# Patient Record
Sex: Female | Born: 1955 | Race: White | Hispanic: No | Smoking: Former smoker
Health system: Southern US, Community
[De-identification: ages and names within clinical notes are randomized; demographics above are authoritative.]

## PROBLEM LIST (undated history)

## (undated) DIAGNOSIS — R519 Headache, unspecified: Secondary | ICD-10-CM

## (undated) DIAGNOSIS — R42 Dizziness and giddiness: Secondary | ICD-10-CM

## (undated) DIAGNOSIS — D649 Anemia, unspecified: Secondary | ICD-10-CM

## (undated) DIAGNOSIS — M1990 Unspecified osteoarthritis, unspecified site: Secondary | ICD-10-CM

## (undated) DIAGNOSIS — R51 Headache: Secondary | ICD-10-CM

## (undated) DIAGNOSIS — Z8489 Family history of other specified conditions: Secondary | ICD-10-CM

## (undated) DIAGNOSIS — R011 Cardiac murmur, unspecified: Secondary | ICD-10-CM

## (undated) DIAGNOSIS — Z9889 Other specified postprocedural states: Secondary | ICD-10-CM

## (undated) DIAGNOSIS — R112 Nausea with vomiting, unspecified: Secondary | ICD-10-CM

## (undated) HISTORY — PX: SHOULDER SURGERY: SHX246

## (undated) HISTORY — PX: JOINT REPLACEMENT: SHX530

## (undated) HISTORY — PX: FRACTURE SURGERY: SHX138

## (undated) HISTORY — PX: TONSILLECTOMY: SUR1361

## (undated) HISTORY — PX: WRIST FRACTURE SURGERY: SHX121

## (undated) HISTORY — PX: BREAST SURGERY: SHX581

## (undated) HISTORY — PX: ABDOMINAL HYSTERECTOMY: SHX81

## (undated) HISTORY — PX: ACHILLES TENDON REPAIR: SUR1153

## (undated) HISTORY — PX: BACK SURGERY: SHX140

---

## 1999-12-09 ENCOUNTER — Other Ambulatory Visit: Admission: RE | Admit: 1999-12-09 | Discharge: 1999-12-09 | Payer: Self-pay | Admitting: Obstetrics and Gynecology

## 2004-09-07 ENCOUNTER — Emergency Department (HOSPITAL_COMMUNITY): Admission: EM | Admit: 2004-09-07 | Discharge: 2004-09-07 | Payer: Self-pay | Admitting: Family Medicine

## 2006-05-04 ENCOUNTER — Emergency Department (HOSPITAL_COMMUNITY): Admission: EM | Admit: 2006-05-04 | Discharge: 2006-05-04 | Payer: Self-pay | Admitting: Family Medicine

## 2009-11-16 ENCOUNTER — Emergency Department (HOSPITAL_COMMUNITY): Admission: EM | Admit: 2009-11-16 | Discharge: 2009-11-16 | Payer: Self-pay | Admitting: Emergency Medicine

## 2010-01-29 ENCOUNTER — Encounter: Admission: RE | Admit: 2010-01-29 | Discharge: 2010-01-29 | Payer: Self-pay | Admitting: Sports Medicine

## 2010-02-24 ENCOUNTER — Encounter
Admission: RE | Admit: 2010-02-24 | Discharge: 2010-02-24 | Payer: Self-pay | Source: Home / Self Care | Attending: Obstetrics and Gynecology | Admitting: Obstetrics and Gynecology

## 2010-04-04 ENCOUNTER — Encounter: Payer: Self-pay | Admitting: Sports Medicine

## 2010-04-27 ENCOUNTER — Encounter (HOSPITAL_BASED_OUTPATIENT_CLINIC_OR_DEPARTMENT_OTHER)
Admission: RE | Admit: 2010-04-27 | Discharge: 2010-04-27 | Disposition: A | Discharge status: Home / Self Care | Payer: BC Managed Care – PPO | Source: Ambulatory Visit | Attending: Orthopedic Surgery | Admitting: Orthopedic Surgery

## 2010-04-27 DIAGNOSIS — Z01810 Encounter for preprocedural cardiovascular examination: Secondary | ICD-10-CM | POA: Insufficient documentation

## 2010-04-29 ENCOUNTER — Ambulatory Visit (HOSPITAL_BASED_OUTPATIENT_CLINIC_OR_DEPARTMENT_OTHER)
Admission: RE | Admit: 2010-04-29 | Discharge: 2010-04-29 | Disposition: A | Discharge status: Home / Self Care | Payer: BC Managed Care – PPO | Source: Ambulatory Visit | Attending: Orthopedic Surgery | Admitting: Orthopedic Surgery

## 2010-04-29 DIAGNOSIS — M25819 Other specified joint disorders, unspecified shoulder: Secondary | ICD-10-CM | POA: Insufficient documentation

## 2010-04-29 DIAGNOSIS — M948X9 Other specified disorders of cartilage, unspecified sites: Secondary | ICD-10-CM | POA: Insufficient documentation

## 2010-04-29 DIAGNOSIS — M7500 Adhesive capsulitis of unspecified shoulder: Secondary | ICD-10-CM | POA: Insufficient documentation

## 2010-04-29 DIAGNOSIS — Z01812 Encounter for preprocedural laboratory examination: Secondary | ICD-10-CM | POA: Insufficient documentation

## 2010-04-29 LAB — POCT HEMOGLOBIN-HEMACUE: Hemoglobin: 12.4 g/dL (ref 12.0–15.0)

## 2010-05-03 NOTE — Op Note (Signed)
  Taylor Travis, Taylor Travis NO.:  [id number]  MEDICAL RECORD NO.:  [id number]           PATIENT TYPE:  LOCATION:                                 FACILITY:  PHYSICIAN:  Loreta Ave, M.D. DATE OF BIRTH:  Nov 06, 1955  DATE OF PROCEDURE:  04/29/2010 DATE OF DISCHARGE:                              OPERATIVE REPORT   PREOPERATIVE DIAGNOSES:  Right shoulder marked adhesive capsulitis. Subacromial impingement.  Distal clavicle osteolysis.  POSTOPERATIVE DIAGNOSES:  Right shoulder marked adhesive capsulitis. Subacromial impingement.  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  Also, a focal deep grade 3 lesion, posteroinferior aspect humeral head 1.5 cm diameter with chondral debris.  PROCEDURE:  Right shoulder exam under anesthesia, arthroscopy. Manipulation and break up of adhesions.  Extensive debridement of synovitis, intra and extra-articular adhesions.  Chondroplasty of humeral head.  Bursectomy, acromioplasty, and CA ligament release. Excision of distal clavicle.  SURGEON:  Loreta Ave, MD  ASSISTANT:  Genene Churn. Barry Dienes, PA  ANESTHESIA:  General.  BLOOD LOSS:  Minimal.  SPECIMEN:  None.  CULTURES:  None.  COMPLICATIONS:  None.  DRESSING:  Soft compressive with sling.  PROCEDURE:  The patient was brought to the operating room and placed on the operating table in supine position.  After adequate anesthesia had been obtained, shoulder was examined.  More than 50% loss of motion. Manipulated breaking up adhesions.  Achieving full motion stable shoulder.  Placed in beach-chair position on the shoulder positioner and prepped and draped in usual sterile fashion.  Three portals anterior, posterior and lateral.  Arthroscope introduced and the shoulder distended and inspected.  Extensive synovitis and adhesions, debrided throughout.  There was a focal 1.5-cm near full-thickness deep  grade 3 lesion, posteroinferior humeral head with chondral debris and fragmentation.  Chondroplasty to a stable surface.  Remaining glenoid looked good.  The labrum, biceps tendon, biceps anchor, rotator cuff all intact.  After thorough debridement from the front and back of adhesion synovitis and the humeral head, cannula redirected subacromially. Marked adhesive bursitis, all debrided out.  Type 3 acromion.  Some abrasive changes on the top of the cuff.  Bursa resected.  Acromioplasty to a type 1 acromion, releasing the CA ligament.  Distal clavicle exposed.  Grade 4 changes with marked spurring.  Periarticular spurs and lateral centimeter of clavicle resected.  Adequacy of decompression confirmed viewing from all portals.  Instruments and fluid removed.  Portals were closed with nylon.  Sterile compressive dressing applied.  Sling applied.  Anesthesia reversed.  Brought to the recovery room.  Tolerated the surgery well.  No complications.     Loreta Ave, M.D.     DFM/MEDQ  D:  04/29/2010  T:  04/30/2010  Job:  [id number]  Electronically Signed by Mckinley Jewel M.D. on 05/03/2010 02:59:17 PM

## 2011-05-22 ENCOUNTER — Encounter (HOSPITAL_COMMUNITY): Payer: Self-pay | Admitting: Emergency Medicine

## 2011-05-22 ENCOUNTER — Emergency Department (HOSPITAL_COMMUNITY)
Admission: EM | Admit: 2011-05-22 | Discharge: 2011-05-22 | Disposition: A | Discharge status: Home Health | Payer: BC Managed Care – PPO | Attending: Emergency Medicine | Admitting: Emergency Medicine

## 2011-05-22 ENCOUNTER — Emergency Department (HOSPITAL_COMMUNITY): Payer: BC Managed Care – PPO

## 2011-05-22 DIAGNOSIS — S52509A Unspecified fracture of the lower end of unspecified radius, initial encounter for closed fracture: Secondary | ICD-10-CM

## 2011-05-22 DIAGNOSIS — S52599A Other fractures of lower end of unspecified radius, initial encounter for closed fracture: Secondary | ICD-10-CM | POA: Insufficient documentation

## 2011-05-22 DIAGNOSIS — W19XXXA Unspecified fall, initial encounter: Secondary | ICD-10-CM | POA: Insufficient documentation

## 2011-05-22 DIAGNOSIS — M25539 Pain in unspecified wrist: Secondary | ICD-10-CM | POA: Insufficient documentation

## 2011-05-22 DIAGNOSIS — M25439 Effusion, unspecified wrist: Secondary | ICD-10-CM | POA: Insufficient documentation

## 2011-05-22 MED ORDER — ONDANSETRON HCL 4 MG/2ML IJ SOLN
4.0000 mg | Freq: Once | INTRAMUSCULAR | Status: AC
  Administered 2011-05-22: 4 mg via INTRAVENOUS
  Filled 2011-05-22: qty 2

## 2011-05-22 MED ORDER — MORPHINE SULFATE 4 MG/ML IJ SOLN
4.0000 mg | Freq: Once | INTRAMUSCULAR | Status: AC
  Administered 2011-05-22: 4 mg via INTRAVENOUS
  Filled 2011-05-22: qty 1

## 2011-05-22 MED ORDER — HYDROCODONE-ACETAMINOPHEN 5-500 MG PO TABS: 1.0000 | ORAL_TABLET | Freq: Four times a day (QID) | ORAL | Status: AC | PRN

## 2011-05-22 NOTE — ED Provider Notes (Signed)
History     CSN: [id number]  Arrival date & time 05/22/11  1840   First MD Initiated Contact with Patient 05/22/11 2010      Chief Complaint  Patient presents with  . Fall  . Wrist Pain    (Consider location/radiation/quality/duration/timing/severity/associated sxs/prior treatment) HPI Comments: Mrs. it going down 3 steps hit her left hip and landed with her right arm twisted behind her now having deformity of the right wrist  Patient is a 56 y.o. female presenting with fall and wrist pain. The history is provided by the patient.  Fall The accident occurred 1 to 2 hours ago. The fall occurred while walking. She fell from a height of 3 to 5 ft. She landed on a hard floor. The pain is present in the right wrist. The pain is at a severity of 5/10. The pain is moderate. She was ambulatory at the scene. There was no entrapment after the fall. There was no drug use involved in the accident. There was no alcohol use involved in the accident. Pertinent negatives include no fever and no numbness. The symptoms are aggravated by flexion and extension.  Wrist Pain Associated symptoms include joint swelling. Pertinent negatives include no fever or numbness.    History reviewed. No pertinent past medical history.  Past Surgical History  Procedure Date  . Achilles tendon repair   . Back surgery   . Shoulder surgery   . Cesarean section     No family history on file.  History  Substance Use Topics  . Smoking status: Former Games developer  . Smokeless tobacco: Not on file  . Alcohol Use: Yes    OB History    Grav Para Term Preterm Abortions TAB SAB Ect Mult Living                  Review of Systems  Constitutional: Negative for fever.  Musculoskeletal: Positive for joint swelling.  Neurological: Negative for numbness.    Allergies  Oxycodone  Home Medications   Current Outpatient Rx  Name Route Sig Dispense Refill  . ACETAMINOPHEN 500 MG PO TABS Oral Take 1,000 mg by mouth every  6 (six) hours as needed. For pain    . HYDROCODONE-ACETAMINOPHEN 5-500 MG PO TABS Oral Take 1-2 tablets by mouth every 6 (six) hours as needed for pain. 15 tablet 0    BP 127/68  Pulse 91  Temp(Src) 98.7 F (37.1 C) (Oral)  Resp 18  SpO2 99%  Physical Exam  Constitutional: She appears well-developed and well-nourished.  HENT:  Head: Normocephalic.  Eyes: Pupils are equal, round, and reactive to light.  Neck: Normal range of motion.  Cardiovascular: Normal rate.   Pulmonary/Chest: Effort normal.  Musculoskeletal:       Obvious swelling, and deformity of right wrist over the radius.  Range of motion of the fingers, but painful can elevate thumb positive pulses, cap refill less than 3 seconds  Neurological: She is alert.  Skin: Skin is warm.    ED Course  Procedures (including critical care time)  Labs Reviewed - No data to display Dg Wrist Complete Right  05/22/2011  *RADIOLOGY REPORT*  Clinical Data: Injured right wrist.  RIGHT WRIST - COMPLETE 3+ VIEW  Comparison: None  Findings: There is a comminuted intra-articular fracture of the distal radius with dorsal impaction.  No definite distal ulnar fracture.  There is widening of the scapholunate joint space.  IMPRESSION: Comminuted intra-articular fracture of the distal radius. Widened scapholunate joint space may  suggest ligament injury or insufficiency.  Original Report Authenticated By: P. Loralie Champagne, M.D.     1. Distal radius fracture       MDM  X-ray reveals a comminuted, impacted distal radius.  Will contact Dr. Melvyn Novas who is on-call for hand surgery        Arman Filter, NP 05/22/11 2120

## 2011-05-22 NOTE — Progress Notes (Signed)
Orthopedic Tech Progress Note Patient Details:  Taylor Travis 29-Sep-1955 [id number]  Other Ortho Devices Type of Ortho Device: Ace wrap Ortho Device Location: (R) UE Ortho Device Interventions: Application  Type of Splint: Sugartong Splint Location: (R) UE Splint Interventions: Application    Jennye Moccasin 05/22/2011, 9:14 PM

## 2011-05-22 NOTE — ED Notes (Signed)
Pt c/o falling down 2 steps & hitting L hip & R arm landing on concrete, pt states, " I feel jarred everywhere, my lower back is aching, my L hip is hurting, & my R wrist is really hurting."

## 2011-05-22 NOTE — ED Notes (Signed)
Pt states she fell down 2 steps just pta.  States she landed on her back with her R arm behind her.  C/o pain to R wrist/forearm and L hip.  Denies LOC.   Denies neck pain.

## 2011-05-22 NOTE — Discharge Instructions (Signed)
Forearm Fracture Your caregiver has diagnosed you as having a broken bone (fracture) of the forearm. This is the part of your arm between the elbow and your wrist. Your forearm is made up of two bones. These are the radius and ulna. A fracture is a break in one or both bones. A cast or splint is used to protect and keep your injured bone from moving. The cast or splint will be on generally for about 5 to 6 weeks, with individual variations. HOME CARE INSTRUCTIONS   Keep the injured part elevated while sitting or lying down. Keeping the injury above the level of your heart (the center of the chest). This will decrease swelling and pain.   Apply ice to the injury for 15 to 20 minutes, 3 to 4 times per day while awake, for 2 days. Put the ice in a plastic bag and place a thin towel between the bag of ice and your cast or splint.   If you have a plaster or fiberglass cast:   Do not try to scratch the skin under the cast using sharp or pointed objects.   Check the skin around the cast every day. You may put lotion on any red or sore areas.   Keep your cast dry and clean.   If you have a plaster splint:   Wear the splint as directed.   You may loosen the elastic around the splint if your fingers become numb, tingle, or turn cold or blue.   Do not put pressure on any part of your cast or splint. It may break. Rest your cast only on a pillow the first 24 hours until it is fully hardened.   Your cast or splint can be protected during bathing with a plastic bag. Do not lower the cast or splint into water.   Only take over-the-counter or prescription medicines for pain, discomfort, or fever as directed by your caregiver.  SEEK IMMEDIATE MEDICAL CARE IF:   Your cast gets damaged or breaks.   You have more severe pain or swelling than you did before the cast.   Your skin or nails below the injury turn blue or gray, or feel cold or numb.   There is a bad smell or new stains and/or pus like  (purulent) drainage coming from under the cast.  MAKE SURE YOU:   Understand these instructions.   Will watch your condition.   Will get help right away if you are not doing well or get worse.  Document Released: 02/26/2000 Document Revised: 02/17/2011 Document Reviewed: 10/18/2007 [facility] Patient Information 2012 [place], [place]. Dr. Cheree Ditto it would be expected to 30 please: The morning to verify this appointment

## 2011-05-22 NOTE — ED Provider Notes (Signed)
Medical screening examination/treatment/procedure(s) were performed by non-physician practitioner and as supervising physician I was immediately available for consultation/collaboration.   Forbes Cellar, MD 05/22/11 2129

## 2011-10-04 ENCOUNTER — Other Ambulatory Visit: Payer: Self-pay | Admitting: Obstetrics and Gynecology

## 2011-10-04 DIAGNOSIS — Z1231 Encounter for screening mammogram for malignant neoplasm of breast: Secondary | ICD-10-CM

## 2011-10-14 ENCOUNTER — Ambulatory Visit: Payer: BC Managed Care – PPO

## 2011-10-21 ENCOUNTER — Ambulatory Visit
Admission: RE | Admit: 2011-10-21 | Discharge: 2011-10-21 | Disposition: A | Discharge status: Home / Self Care | Payer: BC Managed Care – PPO | Source: Ambulatory Visit | Attending: Obstetrics and Gynecology | Admitting: Obstetrics and Gynecology

## 2011-10-21 DIAGNOSIS — Z1231 Encounter for screening mammogram for malignant neoplasm of breast: Secondary | ICD-10-CM

## 2012-10-19 ENCOUNTER — Other Ambulatory Visit: Payer: Self-pay

## 2012-10-19 DIAGNOSIS — Z1231 Encounter for screening mammogram for malignant neoplasm of breast: Secondary | ICD-10-CM

## 2012-11-01 ENCOUNTER — Ambulatory Visit
Admission: RE | Admit: 2012-11-01 | Discharge: 2012-11-01 | Disposition: A | Discharge status: Home / Self Care | Payer: BC Managed Care – PPO | Source: Ambulatory Visit

## 2012-11-01 DIAGNOSIS — Z1231 Encounter for screening mammogram for malignant neoplasm of breast: Secondary | ICD-10-CM

## 2012-12-20 ENCOUNTER — Ambulatory Visit: Payer: Self-pay

## 2012-12-20 ENCOUNTER — Other Ambulatory Visit: Payer: Self-pay | Admitting: Occupational Medicine

## 2012-12-20 DIAGNOSIS — R52 Pain, unspecified: Secondary | ICD-10-CM

## 2013-05-21 ENCOUNTER — Emergency Department (HOSPITAL_COMMUNITY)
Admission: EM | Admit: 2013-05-21 | Discharge: 2013-05-21 | Disposition: A | Discharge status: Home / Self Care | Payer: BC Managed Care – PPO | Source: Home / Self Care

## 2013-05-21 ENCOUNTER — Encounter (HOSPITAL_COMMUNITY): Payer: Self-pay | Admitting: Emergency Medicine

## 2013-05-21 DIAGNOSIS — K529 Noninfective gastroenteritis and colitis, unspecified: Secondary | ICD-10-CM

## 2013-05-21 DIAGNOSIS — K5289 Other specified noninfective gastroenteritis and colitis: Secondary | ICD-10-CM

## 2013-05-21 LAB — POCT I-STAT, CHEM 8
BUN: 13 mg/dL (ref 6–23)
CALCIUM ION: 1.16 mmol/L (ref 1.12–1.23)
Chloride: 103 mEq/L (ref 96–112)
Creatinine, Ser: 0.9 mg/dL (ref 0.50–1.10)
GLUCOSE: 100 mg/dL — AB (ref 70–99)
HEMATOCRIT: 44 % (ref 36.0–46.0)
HEMOGLOBIN: 15 g/dL (ref 12.0–15.0)
Potassium: 3.9 mEq/L (ref 3.7–5.3)
Sodium: 141 mEq/L (ref 137–147)
TCO2: 27 mmol/L (ref 0–100)

## 2013-05-21 MED ORDER — MECLIZINE HCL 50 MG PO TABS
50.0000 mg | ORAL_TABLET | Freq: Three times a day (TID) | ORAL | Status: DC | PRN
Start: 2013-05-21 — End: 2013-09-19

## 2013-05-21 NOTE — ED Notes (Signed)
Pt  Reports  Nausea     dizzyness  And  Headache     She  Has         Had diarrhea      And  Vomiting  sev days     The  Symptoms    Stopped  yest          She  States  She    Has      Vertigo  In past

## 2013-05-21 NOTE — ED Provider Notes (Signed)
CSN: [id number]     Arrival date & time 05/21/13  1157 History   None    Chief Complaint  Patient presents with  . Dizziness   (Consider location/radiation/quality/duration/timing/severity/associated sxs/prior Treatment) Patient is a 58 y.o. female presenting with dizziness. The history is provided by the patient and a parent.  Dizziness Quality:  Imbalance Severity:  Mild Onset quality:  Sudden Duration:  1 day Progression:  Unchanged Chronicity:  New Context: standing up   Context comment:  Onset of n/v/d on sun, resolved , stillwith gait unsteadiness. Associated symptoms: diarrhea and vomiting   Associated symptoms: no palpitations and no shortness of breath     History reviewed. No pertinent past medical history. Past Surgical History  Procedure Laterality Date  . Achilles tendon repair    . Back surgery    . Shoulder surgery    . Cesarean section     History reviewed. No pertinent family history. History  Substance Use Topics  . Smoking status: Former Games developermoker  . Smokeless tobacco: Not on file  . Alcohol Use: Yes   OB History   Grav Para Term Preterm Abortions TAB SAB Ect Mult Living                 Review of Systems  Constitutional: Negative.   Respiratory: Negative for shortness of breath.   Cardiovascular: Negative for palpitations.  Gastrointestinal: Positive for vomiting and diarrhea.  Musculoskeletal: Positive for gait problem.  Skin: Negative.   Neurological: Positive for dizziness.    Allergies  Oxycodone  Home Medications   Current Outpatient Rx  Name  Route  Sig  Dispense  Refill  . MECLIZINE HCL PO   Oral   Take by mouth.         Marland Kitchen. acetaminophen (TYLENOL) 500 MG tablet   Oral   Take 1,000 mg by mouth every 6 (six) hours as needed. For pain         . meclizine (ANTIVERT) 50 MG tablet   Oral   Take 1 tablet (50 mg total) by mouth 3 (three) times daily as needed.   20 tablet   0    BP 149/91  Pulse 105  Temp(Src) 98.7 F (37.1  C) (Oral)  Resp 16  SpO2 100% Physical Exam  Nursing note and vitals reviewed. Constitutional: She is oriented to person, place, and time. She appears well-developed and well-nourished.  HENT:  Mouth/Throat: Oropharynx is clear and moist.  Eyes: EOM are normal. Pupils are equal, round, and reactive to light.  Neck: Normal range of motion. Neck supple.  Cardiovascular: Normal rate, regular rhythm, normal heart sounds and intact distal pulses.   Pulmonary/Chest: Effort normal and breath sounds normal.  Lymphadenopathy:    She has no cervical adenopathy.  Neurological: She is alert and oriented to person, place, and time.  Skin: Skin is warm and dry.    ED Course  Procedures (including critical care time) Labs Review Labs Reviewed  POCT I-STAT, CHEM 8 - Abnormal; Notable for the following:    Glucose, Bld 100 (*)    All other components within normal limits   Imaging Review No results found. i-stat wnl.  MDM   1. Gastroenteritis, acute        Linna HoffJames D Eugene Zeiders, MD 05/21/13 1432

## 2013-09-17 ENCOUNTER — Other Ambulatory Visit: Payer: Self-pay | Admitting: Physician Assistant

## 2013-09-17 NOTE — H&P (Signed)
TOTAL KNEE ADMISSION H&P  Patient is being admitted for right total knee arthroplasty.  Subjective:  Chief Complaint:right knee pain.  HPI: Taylor Travis, 58 y.o. female, has a history of pain and functional disability in the right knee due to arthritis and has failed non-surgical conservative treatments for greater than 12 weeks to includeNSAID's and/or analgesics, corticosteriod injections and activity modification.  Onset of symptoms was gradual, starting 2 years ago with gradually worsening course since that time. The patient noted prior procedures on the knee to include  arthroscopy and menisectomy on the right knee(s).  Patient currently rates pain in the right knee(s) at 8 out of 10 with activity. Patient has night pain, worsening of pain with activity and weight bearing, pain that interferes with activities of daily living, pain with passive range of motion, crepitus and joint swelling.  Patient has evidence of subchondral cysts, subchondral sclerosis and joint space narrowing by imaging studies. There is no active infection.  There are no active problems to display for this patient.  No past medical history on file.  Past Surgical History  Procedure Laterality Date  . Achilles tendon repair    . Back surgery    . Shoulder surgery    . Cesarean section       (Not in a hospital admission) Allergies  Allergen Reactions  . Oxycodone Nausea And Vomiting    History  Substance Use Topics  . Smoking status: Former Games developermoker  . Smokeless tobacco: Not on file  . Alcohol Use: Yes    No family history on file.   Review of Systems  Constitutional: Negative.   HENT: Negative.   Eyes: Negative.   Respiratory: Negative.   Cardiovascular: Negative.   Gastrointestinal: Negative.   Genitourinary: Negative.   Musculoskeletal: Positive for joint pain.  Skin: Negative.   Neurological: Negative.   Endo/Heme/Allergies: Negative.   Psychiatric/Behavioral: Negative.      Objective:  Physical Exam  Constitutional: She is oriented to person, place, and time. She appears well-developed and well-nourished.  HENT:  Head: Normocephalic and atraumatic.  Eyes: EOM are normal.  Neck: Normal range of motion. Neck supple.  Cardiovascular: Normal rate, regular rhythm and normal heart sounds.  Exam reveals no gallop and no friction rub.   No murmur heard. Respiratory: Effort normal. No respiratory distress. She has no wheezes. She has no rales.  GI: Soft. Bowel sounds are normal. She exhibits no distension.  Musculoskeletal:  Antalgic gait, valgus thrust on the right.  Negative straight leg raise both sides.  Negative log roll of both hips.  Right knee motion 5-90 with more than ten degrees of valgus.  The left is 0-100.  In a little bit of varus.  Profound patellofemoral crepitus, both sides.  Neurovascularly intact distally.    Neurological: She is alert and oriented to person, place, and time.  Skin: Skin is warm and dry.  Psychiatric: She has a normal mood and affect. Her behavior is normal. Judgment and thought content normal.    Vital signs in last 24 hours: @VSRANGES @  Labs:   There is no height or weight on file to calculate BMI.   Imaging Review Plain radiographs demonstrate severe degenerative joint disease of the right knee(s). The overall alignment ismild varus. The bone quality appears to be fair for age and reported activity level.  Assessment/Plan:  End stage arthritis, right knee   The patient history, physical examination, clinical judgment of the provider and imaging studies are consistent with end stage  degenerative joint disease of the right knee(s) and total knee arthroplasty is deemed medically necessary. The treatment options including medical management, injection therapy arthroscopy and arthroplasty were discussed at length. The risks and benefits of total knee arthroplasty were presented and reviewed. The risks due to aseptic  loosening, infection, stiffness, patella tracking problems, thromboembolic complications and other imponderables were discussed. The patient acknowledged the explanation, agreed to proceed with the plan and consent was signed. Patient is being admitted for inpatient treatment for surgery, pain control, PT, OT, prophylactic antibiotics, VTE prophylaxis, progressive ambulation and ADL's and discharge planning. The patient is planning to be discharged home with home health services

## 2013-09-24 ENCOUNTER — Other Ambulatory Visit (HOSPITAL_COMMUNITY): Payer: Self-pay

## 2013-09-24 ENCOUNTER — Ambulatory Visit (HOSPITAL_COMMUNITY)
Admission: RE | Admit: 2013-09-24 | Discharge: 2013-09-24 | Disposition: A | Discharge status: Home / Self Care | Payer: BC Managed Care – PPO | Source: Ambulatory Visit | Attending: Physician Assistant | Admitting: Physician Assistant

## 2013-09-24 ENCOUNTER — Encounter (HOSPITAL_COMMUNITY)
Admission: RE | Admit: 2013-09-24 | Discharge: 2013-09-24 | Disposition: A | Discharge status: Home / Self Care | Payer: BC Managed Care – PPO | Source: Ambulatory Visit | Attending: Orthopedic Surgery | Admitting: Orthopedic Surgery

## 2013-09-24 ENCOUNTER — Encounter (HOSPITAL_COMMUNITY): Payer: Self-pay

## 2013-09-24 DIAGNOSIS — Z01818 Encounter for other preprocedural examination: Secondary | ICD-10-CM | POA: Diagnosis present

## 2013-09-24 HISTORY — DX: Other specified postprocedural states: R11.2

## 2013-09-24 HISTORY — DX: Cardiac murmur, unspecified: R01.1

## 2013-09-24 HISTORY — DX: Unspecified osteoarthritis, unspecified site: M19.90

## 2013-09-24 HISTORY — DX: Other specified postprocedural states: Z98.890

## 2013-09-24 LAB — ABO/RH: ABO/RH(D): AB NEG

## 2013-09-24 LAB — COMPREHENSIVE METABOLIC PANEL
ALBUMIN: 4 g/dL (ref 3.5–5.2)
ALT: 20 U/L (ref 0–35)
AST: 17 U/L (ref 0–37)
Alkaline Phosphatase: 83 U/L (ref 39–117)
Anion gap: 13 (ref 5–15)
BUN: 21 mg/dL (ref 6–23)
CALCIUM: 9.2 mg/dL (ref 8.4–10.5)
CO2: 26 mEq/L (ref 19–32)
CREATININE: 0.75 mg/dL (ref 0.50–1.10)
Chloride: 101 mEq/L (ref 96–112)
GFR calc Af Amer: >90 mL/min (ref >90)
GFR calc non Af Amer: >90 mL/min (ref >90)
Glucose, Bld: 96 mg/dL (ref 70–99)
Potassium: 4.1 mEq/L (ref 3.7–5.3)
Sodium: 140 mEq/L (ref 137–147)
Total Bilirubin: 0.3 mg/dL (ref 0.3–1.2)
Total Protein: 7.1 g/dL (ref 6.0–8.3)

## 2013-09-24 LAB — CBC WITH DIFFERENTIAL/PLATELET
BASOS ABS: 0 10*3/uL (ref 0.0–0.1)
Basophils Relative: 0 % (ref 0–1)
EOS PCT: 3 % (ref 0–5)
Eosinophils Absolute: 0.3 10*3/uL (ref 0.0–0.7)
HEMATOCRIT: 39 % (ref 36.0–46.0)
Hemoglobin: 12.8 g/dL (ref 12.0–15.0)
Lymphocytes Relative: 32 % (ref 12–46)
Lymphs Abs: 2.5 10*3/uL (ref 0.7–4.0)
MCH: 28.3 pg (ref 26.0–34.0)
MCHC: 32.8 g/dL (ref 30.0–36.0)
MCV: 86.1 fL (ref 78.0–100.0)
MONO ABS: 0.5 10*3/uL (ref 0.1–1.0)
Monocytes Relative: 6 % (ref 3–12)
Neutro Abs: 4.7 10*3/uL (ref 1.7–7.7)
Neutrophils Relative %: 59 % (ref 43–77)
PLATELETS: 193 10*3/uL (ref 150–400)
RBC: 4.53 MIL/uL (ref 3.87–5.11)
RDW: 13.3 % (ref 11.5–15.5)
WBC: 8 10*3/uL (ref 4.0–10.5)

## 2013-09-24 LAB — URINALYSIS, ROUTINE W REFLEX MICROSCOPIC
BILIRUBIN URINE: NEGATIVE
Glucose, UA: NEGATIVE mg/dL
Hgb urine dipstick: NEGATIVE
Ketones, ur: NEGATIVE mg/dL
Leukocytes, UA: NEGATIVE
NITRITE: NEGATIVE
Protein, ur: NEGATIVE mg/dL
Specific Gravity, Urine: 1.026 (ref 1.005–1.030)
UROBILINOGEN UA: 0.2 mg/dL (ref 0.0–1.0)
pH: 5.5 (ref 5.0–8.0)

## 2013-09-24 LAB — PROTIME-INR
INR: 1.02 (ref 0.00–1.49)
PROTHROMBIN TIME: 13.4 s (ref 11.6–15.2)

## 2013-09-24 LAB — SURGICAL PCR SCREEN
MRSA, PCR: NEGATIVE
STAPHYLOCOCCUS AUREUS: NEGATIVE

## 2013-09-24 LAB — TYPE AND SCREEN
ABO/RH(D): AB NEG
ANTIBODY SCREEN: NEGATIVE

## 2013-09-24 LAB — APTT: APTT: 30 s (ref 24–37)

## 2013-09-24 MED ORDER — LACTATED RINGERS IV SOLN: INTRAVENOUS | Status: DC

## 2013-09-24 NOTE — Pre-Procedure Instructions (Signed)
Taylor Travis  09/24/2013   Your procedure is scheduled on:  10/02/13  [facility].  Call this number if you have problems the morning of surgery: [phone]   Remember:   Do not eat food or drink liquids after midnight.   Take these medicines the morning of surgery with A SIP OF WATER: tylenol   Do not wear jewelry, make-up or nail polish.  Do not wear lotions, powders, or perfumes. You may wear deodorant.  Do not shave 48 hours prior to surgery. Men may shave face and neck.  Do not bring valuables to the hospital.  [facility] is not responsible                  for any belongings or valuables.               Contacts, dentures or bridgework may not be worn into surgery.  Leave suitcase in the car. After surgery it may be brought to your room.  For patients admitted to the hospital, discharge time is determined by your                treatment team.               Patients discharged the day of surgery will not be allowed to drive  home.  Name and phone number of your driver: family  Special Instructions: Shower using CHG 2 nights before surgery and the night before surgery.  If you shower the day of surgery use CHG.  Use special wash - you have one bottle of CHG for all showers.  You should use approximately 1/3 of the bottle for each shower.   Please read over the following fact sheets that you were given: Pain Booklet, Coughing and Deep Breathing, Blood Transfusion Information, MRSA Information and Surgical Site Infection Prevention

## 2013-10-01 MED ORDER — DEXTROSE 5 % IV SOLN
3.0000 g | INTRAVENOUS | Status: AC
  Administered 2013-10-02: 3 g via INTRAVENOUS
  Filled 2013-10-01: qty 3000

## 2013-10-02 ENCOUNTER — Encounter (HOSPITAL_COMMUNITY)
Admission: RE | Disposition: A | Discharge status: Home Health | Payer: Self-pay | Source: Ambulatory Visit | Attending: Orthopedic Surgery

## 2013-10-02 ENCOUNTER — Inpatient Hospital Stay (HOSPITAL_COMMUNITY): Payer: BC Managed Care – PPO | Admitting: Anesthesiology

## 2013-10-02 ENCOUNTER — Encounter (HOSPITAL_COMMUNITY): Payer: Self-pay | Admitting: *Deleted

## 2013-10-02 ENCOUNTER — Inpatient Hospital Stay (HOSPITAL_COMMUNITY): Payer: BC Managed Care – PPO

## 2013-10-02 ENCOUNTER — Inpatient Hospital Stay (HOSPITAL_COMMUNITY)
Admission: RE | Admit: 2013-10-02 | Discharge: 2013-10-04 | DRG: 470 | Disposition: A | Discharge status: Home Health | Payer: BC Managed Care – PPO | Source: Ambulatory Visit | Attending: Orthopedic Surgery | Admitting: Orthopedic Surgery

## 2013-10-02 ENCOUNTER — Encounter (HOSPITAL_COMMUNITY): Payer: BC Managed Care – PPO | Admitting: Anesthesiology

## 2013-10-02 DIAGNOSIS — Z87891 Personal history of nicotine dependence: Secondary | ICD-10-CM

## 2013-10-02 DIAGNOSIS — R42 Dizziness and giddiness: Secondary | ICD-10-CM | POA: Diagnosis not present

## 2013-10-02 DIAGNOSIS — M1711 Unilateral primary osteoarthritis, right knee: Secondary | ICD-10-CM

## 2013-10-02 DIAGNOSIS — D62 Acute posthemorrhagic anemia: Secondary | ICD-10-CM | POA: Diagnosis not present

## 2013-10-02 DIAGNOSIS — M1710 Unilateral primary osteoarthritis, unspecified knee: Principal | ICD-10-CM | POA: Diagnosis present

## 2013-10-02 DIAGNOSIS — Z888 Allergy status to other drugs, medicaments and biological substances status: Secondary | ICD-10-CM

## 2013-10-02 DIAGNOSIS — M179 Osteoarthritis of knee, unspecified: Secondary | ICD-10-CM | POA: Diagnosis present

## 2013-10-02 DIAGNOSIS — Z79899 Other long term (current) drug therapy: Secondary | ICD-10-CM

## 2013-10-02 DIAGNOSIS — Z7982 Long term (current) use of aspirin: Secondary | ICD-10-CM

## 2013-10-02 HISTORY — PX: TOTAL KNEE ARTHROPLASTY: SHX125

## 2013-10-02 HISTORY — DX: Dizziness and giddiness: R42

## 2013-10-02 HISTORY — DX: Family history of other specified conditions: Z84.89

## 2013-10-02 SURGERY — ARTHROPLASTY, KNEE, TOTAL
Anesthesia: General | Site: Hip | Laterality: Right

## 2013-10-02 MED ORDER — METHOCARBAMOL 500 MG PO TABS
ORAL_TABLET | ORAL | Status: AC
  Filled 2013-10-02: qty 1

## 2013-10-02 MED ORDER — ONDANSETRON HCL 4 MG/2ML IJ SOLN
INTRAMUSCULAR | Status: DC | PRN
  Administered 2013-10-02: 4 mg via INTRAVENOUS

## 2013-10-02 MED ORDER — PROPOFOL 10 MG/ML IV BOLUS
INTRAVENOUS | Status: AC
  Filled 2013-10-02: qty 20

## 2013-10-02 MED ORDER — EPHEDRINE SULFATE 50 MG/ML IJ SOLN
INTRAMUSCULAR | Status: AC
  Filled 2013-10-02: qty 1

## 2013-10-02 MED ORDER — CELECOXIB 200 MG PO CAPS
200.0000 mg | ORAL_CAPSULE | Freq: Two times a day (BID) | ORAL | Status: DC
  Administered 2013-10-02 – 2013-10-04 (×4): 200 mg via ORAL
  Filled 2013-10-02 (×5): qty 1

## 2013-10-02 MED ORDER — BUPIVACAINE HCL (PF) 0.25 % IJ SOLN
INTRAMUSCULAR | Status: AC
  Filled 2013-10-02: qty 30

## 2013-10-02 MED ORDER — METOCLOPRAMIDE HCL 5 MG PO TABS
5.0000 mg | ORAL_TABLET | Freq: Three times a day (TID) | ORAL | Status: DC | PRN
  Filled 2013-10-02: qty 2

## 2013-10-02 MED ORDER — ONDANSETRON HCL 4 MG/2ML IJ SOLN
INTRAMUSCULAR | Status: AC
  Filled 2013-10-02: qty 2

## 2013-10-02 MED ORDER — MIDAZOLAM HCL 5 MG/5ML IJ SOLN
INTRAMUSCULAR | Status: DC | PRN
  Administered 2013-10-02 (×2): 1 mg via INTRAVENOUS

## 2013-10-02 MED ORDER — PROPOFOL 10 MG/ML IV BOLUS
INTRAVENOUS | Status: DC | PRN
  Administered 2013-10-02: 200 mg via INTRAVENOUS

## 2013-10-02 MED ORDER — MECLIZINE HCL 25 MG PO TABS
50.0000 mg | ORAL_TABLET | Freq: Three times a day (TID) | ORAL | Status: DC | PRN
  Administered 2013-10-03: 50 mg via ORAL
  Filled 2013-10-02: qty 2

## 2013-10-02 MED ORDER — SODIUM CHLORIDE 0.9 % IJ SOLN
INTRAMUSCULAR | Status: DC | PRN
  Administered 2013-10-02: 40 mL via INTRAVENOUS

## 2013-10-02 MED ORDER — ONDANSETRON HCL 4 MG/2ML IJ SOLN
4.0000 mg | Freq: Four times a day (QID) | INTRAMUSCULAR | Status: AC | PRN
  Administered 2013-10-02: 4 mg via INTRAVENOUS

## 2013-10-02 MED ORDER — LACTATED RINGERS IV SOLN
INTRAVENOUS | Status: DC
  Administered 2013-10-02: 09:00:00 via INTRAVENOUS

## 2013-10-02 MED ORDER — MENTHOL 3 MG MT LOZG: 1.0000 | LOZENGE | OROMUCOSAL | Status: DC | PRN

## 2013-10-02 MED ORDER — ONDANSETRON HCL 4 MG/2ML IJ SOLN
4.0000 mg | Freq: Four times a day (QID) | INTRAMUSCULAR | Status: DC | PRN
  Administered 2013-10-02: 4 mg via INTRAVENOUS
  Filled 2013-10-02: qty 2

## 2013-10-02 MED ORDER — ASPIRIN EC 325 MG PO TBEC
325.0000 mg | DELAYED_RELEASE_TABLET | Freq: Every day | ORAL | Status: DC
  Administered 2013-10-03 – 2013-10-04 (×2): 325 mg via ORAL
  Filled 2013-10-02 (×3): qty 1

## 2013-10-02 MED ORDER — POTASSIUM CHLORIDE IN NACL 20-0.9 MEQ/L-% IV SOLN
INTRAVENOUS | Status: DC
  Administered 2013-10-02: 21:00:00 via INTRAVENOUS
  Filled 2013-10-02 (×3): qty 1000

## 2013-10-02 MED ORDER — FENTANYL CITRATE 0.05 MG/ML IJ SOLN
INTRAMUSCULAR | Status: DC | PRN
  Administered 2013-10-02 (×2): 100 ug via INTRAVENOUS
  Administered 2013-10-02 (×6): 50 ug via INTRAVENOUS

## 2013-10-02 MED ORDER — SODIUM CHLORIDE 0.9 % IR SOLN
Status: DC | PRN
  Administered 2013-10-02: 2000 mL

## 2013-10-02 MED ORDER — BISACODYL 5 MG PO TBEC: 5.0000 mg | DELAYED_RELEASE_TABLET | Freq: Every day | ORAL | Status: DC | PRN

## 2013-10-02 MED ORDER — ASPIRIN EC 325 MG PO TBEC: 325.0000 mg | DELAYED_RELEASE_TABLET | Freq: Every day | ORAL | Status: DC

## 2013-10-02 MED ORDER — HYDROCODONE-ACETAMINOPHEN 7.5-325 MG PO TABS: 1.0000 | ORAL_TABLET | ORAL | Status: DC | PRN

## 2013-10-02 MED ORDER — CHLORHEXIDINE GLUCONATE 4 % EX LIQD
60.0000 mL | Freq: Once | CUTANEOUS | Status: DC
  Filled 2013-10-02: qty 60

## 2013-10-02 MED ORDER — FENTANYL CITRATE 0.05 MG/ML IJ SOLN
INTRAMUSCULAR | Status: AC
  Filled 2013-10-02: qty 5

## 2013-10-02 MED ORDER — BUPIVACAINE LIPOSOME 1.3 % IJ SUSP
INTRAMUSCULAR | Status: DC | PRN
  Administered 2013-10-02: 20 mL

## 2013-10-02 MED ORDER — PHENOL 1.4 % MT LIQD: 1.0000 | OROMUCOSAL | Status: DC | PRN

## 2013-10-02 MED ORDER — DIPHENHYDRAMINE HCL 12.5 MG/5ML PO ELIX: 12.5000 mg | ORAL_SOLUTION | ORAL | Status: DC | PRN

## 2013-10-02 MED ORDER — HYDROMORPHONE HCL PF 1 MG/ML IJ SOLN
INTRAMUSCULAR | Status: AC
  Filled 2013-10-02: qty 1

## 2013-10-02 MED ORDER — ONDANSETRON HCL 4 MG PO TABS: 4.0000 mg | ORAL_TABLET | Freq: Four times a day (QID) | ORAL | Status: DC | PRN

## 2013-10-02 MED ORDER — DEXAMETHASONE 6 MG PO TABS
10.0000 mg | ORAL_TABLET | Freq: Three times a day (TID) | ORAL | Status: DC
  Administered 2013-10-03 – 2013-10-04 (×4): 10 mg via ORAL
  Filled 2013-10-02 (×8): qty 1

## 2013-10-02 MED ORDER — LACTATED RINGERS IV SOLN
INTRAVENOUS | Status: DC | PRN
  Administered 2013-10-02: 09:00:00 via INTRAVENOUS

## 2013-10-02 MED ORDER — ACETAMINOPHEN 325 MG PO TABS
650.0000 mg | ORAL_TABLET | Freq: Four times a day (QID) | ORAL | Status: DC | PRN
  Administered 2013-10-04: 650 mg via ORAL
  Filled 2013-10-02: qty 2

## 2013-10-02 MED ORDER — METOCLOPRAMIDE HCL 5 MG/ML IJ SOLN
5.0000 mg | Freq: Three times a day (TID) | INTRAMUSCULAR | Status: DC | PRN
  Administered 2013-10-02: 10 mg via INTRAVENOUS

## 2013-10-02 MED ORDER — HYDROMORPHONE HCL PF 1 MG/ML IJ SOLN
INTRAMUSCULAR | Status: AC
  Administered 2013-10-02: 0.5 mg via INTRAVENOUS
  Filled 2013-10-02: qty 1

## 2013-10-02 MED ORDER — DOCUSATE SODIUM 100 MG PO CAPS
100.0000 mg | ORAL_CAPSULE | Freq: Two times a day (BID) | ORAL | Status: DC
  Administered 2013-10-03 – 2013-10-04 (×3): 100 mg via ORAL
  Filled 2013-10-02 (×4): qty 1

## 2013-10-02 MED ORDER — DEXAMETHASONE 6 MG PO TABS
10.0000 mg | ORAL_TABLET | Freq: Three times a day (TID) | ORAL | Status: DC
  Filled 2013-10-02 (×2): qty 1

## 2013-10-02 MED ORDER — METHOCARBAMOL 1000 MG/10ML IJ SOLN
500.0000 mg | Freq: Four times a day (QID) | INTRAVENOUS | Status: DC | PRN
  Filled 2013-10-02: qty 5

## 2013-10-02 MED ORDER — SUCCINYLCHOLINE CHLORIDE 20 MG/ML IJ SOLN
INTRAMUSCULAR | Status: DC | PRN
  Administered 2013-10-02: 140 mg via INTRAVENOUS

## 2013-10-02 MED ORDER — ALUM & MAG HYDROXIDE-SIMETH 200-200-20 MG/5ML PO SUSP: 30.0000 mL | ORAL | Status: DC | PRN

## 2013-10-02 MED ORDER — ACETAMINOPHEN 650 MG RE SUPP: 650.0000 mg | Freq: Four times a day (QID) | RECTAL | Status: DC | PRN

## 2013-10-02 MED ORDER — METHOCARBAMOL 500 MG PO TABS: 500.0000 mg | ORAL_TABLET | Freq: Four times a day (QID) | ORAL | Status: DC

## 2013-10-02 MED ORDER — HYDROCODONE-ACETAMINOPHEN 10-325 MG PO TABS
1.0000 | ORAL_TABLET | ORAL | Status: DC | PRN
  Administered 2013-10-03 – 2013-10-04 (×3): 1 via ORAL
  Filled 2013-10-02 (×3): qty 1

## 2013-10-02 MED ORDER — SUCCINYLCHOLINE CHLORIDE 20 MG/ML IJ SOLN
INTRAMUSCULAR | Status: AC
  Filled 2013-10-02: qty 1

## 2013-10-02 MED ORDER — METOCLOPRAMIDE HCL 5 MG/ML IJ SOLN
INTRAMUSCULAR | Status: AC
Start: 2013-10-02 — End: 2013-10-03
  Filled 2013-10-02: qty 2

## 2013-10-02 MED ORDER — LIDOCAINE HCL (CARDIAC) 20 MG/ML IV SOLN
INTRAVENOUS | Status: AC
  Filled 2013-10-02: qty 5

## 2013-10-02 MED ORDER — ARTIFICIAL TEARS OP OINT
TOPICAL_OINTMENT | OPHTHALMIC | Status: DC | PRN
  Administered 2013-10-02: 1 via OPHTHALMIC

## 2013-10-02 MED ORDER — MIDAZOLAM HCL 2 MG/2ML IJ SOLN
INTRAMUSCULAR | Status: AC
  Filled 2013-10-02: qty 2

## 2013-10-02 MED ORDER — ROCURONIUM BROMIDE 50 MG/5ML IV SOLN
INTRAVENOUS | Status: AC
  Filled 2013-10-02: qty 1

## 2013-10-02 MED ORDER — STERILE WATER FOR INJECTION IJ SOLN
INTRAMUSCULAR | Status: AC
  Filled 2013-10-02: qty 10

## 2013-10-02 MED ORDER — ONDANSETRON HCL 4 MG PO TABS: 4.0000 mg | ORAL_TABLET | Freq: Three times a day (TID) | ORAL | Status: DC | PRN

## 2013-10-02 MED ORDER — CEFAZOLIN SODIUM-DEXTROSE 2-3 GM-% IV SOLR
2.0000 g | Freq: Four times a day (QID) | INTRAVENOUS | Status: AC
  Administered 2013-10-02 (×2): 2 g via INTRAVENOUS
  Filled 2013-10-02 (×2): qty 50

## 2013-10-02 MED ORDER — DEXAMETHASONE SODIUM PHOSPHATE 4 MG/ML IJ SOLN
10.0000 mg | Freq: Three times a day (TID) | INTRAMUSCULAR | Status: DC
  Administered 2013-10-02 – 2013-10-03 (×2): 10 mg via INTRAVENOUS
  Filled 2013-10-02 (×8): qty 2.5

## 2013-10-02 MED ORDER — METHOCARBAMOL 500 MG PO TABS
500.0000 mg | ORAL_TABLET | Freq: Four times a day (QID) | ORAL | Status: DC | PRN
  Administered 2013-10-02 – 2013-10-04 (×3): 500 mg via ORAL
  Filled 2013-10-02 (×4): qty 1

## 2013-10-02 MED ORDER — BUPIVACAINE LIPOSOME 1.3 % IJ SUSP
20.0000 mL | Freq: Once | INTRAMUSCULAR | Status: DC
  Filled 2013-10-02: qty 20

## 2013-10-02 MED ORDER — HYDROMORPHONE HCL PF 1 MG/ML IJ SOLN
0.5000 mg | INTRAMUSCULAR | Status: DC | PRN
  Administered 2013-10-02: 0.5 mg via INTRAVENOUS
  Administered 2013-10-02: 1 mg via INTRAVENOUS
  Administered 2013-10-02: 0.5 mg via INTRAVENOUS
  Administered 2013-10-03: 1 mg via INTRAVENOUS
  Filled 2013-10-02 (×2): qty 1

## 2013-10-02 MED ORDER — LIDOCAINE HCL (CARDIAC) 20 MG/ML IV SOLN
INTRAVENOUS | Status: DC | PRN
  Administered 2013-10-02: 80 mg via INTRAVENOUS

## 2013-10-02 MED ORDER — DEXAMETHASONE SODIUM PHOSPHATE 10 MG/ML IJ SOLN
10.0000 mg | Freq: Three times a day (TID) | INTRAMUSCULAR | Status: DC
  Filled 2013-10-02 (×2): qty 1

## 2013-10-02 MED ORDER — HYDROMORPHONE HCL PF 1 MG/ML IJ SOLN
0.2500 mg | INTRAMUSCULAR | Status: DC | PRN
  Administered 2013-10-02 (×2): 0.5 mg via INTRAVENOUS

## 2013-10-02 SURGICAL SUPPLY — 68 items
APL SKNCLS STERI-STRIP NONHPOA (GAUZE/BANDAGES/DRESSINGS) ×1
BANDAGE ELASTIC 4 VELCRO ST LF (GAUZE/BANDAGES/DRESSINGS) ×3 IMPLANT
BANDAGE ELASTIC 6 VELCRO ST LF (GAUZE/BANDAGES/DRESSINGS) ×3 IMPLANT
BANDAGE ESMARK 6X9 LF (GAUZE/BANDAGES/DRESSINGS) ×1 IMPLANT
BENZOIN TINCTURE PRP APPL 2/3 (GAUZE/BANDAGES/DRESSINGS) ×3 IMPLANT
BLADE SAG 18X100X1.27 (BLADE) ×6 IMPLANT
BNDG CMPR 9X6 STRL LF SNTH (GAUZE/BANDAGES/DRESSINGS) ×1
BNDG ESMARK 6X9 LF (GAUZE/BANDAGES/DRESSINGS) ×3
BOWL SMART MIX CTS (DISPOSABLE) ×3 IMPLANT
CEMENT BONE SIMPLEX SPEEDSET (Cement) ×6 IMPLANT
CLOSURE STERI-STRIP 1/2X4 (GAUZE/BANDAGES/DRESSINGS) ×1
CLOSURE WOUND 1/2 X4 (GAUZE/BANDAGES/DRESSINGS) ×2
CLSR STERI-STRIP ANTIMIC 1/2X4 (GAUZE/BANDAGES/DRESSINGS) ×1 IMPLANT
COVER SURGICAL LIGHT HANDLE (MISCELLANEOUS) ×3 IMPLANT
CUFF TOURNIQUET SINGLE 34IN LL (TOURNIQUET CUFF) ×3 IMPLANT
DRAPE EXTREMITY T 121X128X90 (DRAPE) ×3 IMPLANT
DRAPE PROXIMA HALF (DRAPES) ×3 IMPLANT
DRAPE U-SHAPE 47X51 STRL (DRAPES) ×3 IMPLANT
DRSG PAD ABDOMINAL 8X10 ST (GAUZE/BANDAGES/DRESSINGS) ×3 IMPLANT
DURAPREP 26ML APPLICATOR (WOUND CARE) ×6 IMPLANT
ELECT CAUTERY BLADE 6.4 (BLADE) ×3 IMPLANT
ELECT REM PT RETURN 9FT ADLT (ELECTROSURGICAL) ×3
ELECTRODE REM PT RTRN 9FT ADLT (ELECTROSURGICAL) ×1 IMPLANT
EVACUATOR 1/8 PVC DRAIN (DRAIN) ×3 IMPLANT
FACESHIELD WRAPAROUND (MASK) ×6 IMPLANT
FACESHIELD WRAPAROUND OR TEAM (MASK) ×2 IMPLANT
GLOVE BIOGEL PI IND STRL 7.0 (GLOVE) ×2 IMPLANT
GLOVE BIOGEL PI INDICATOR 7.0 (GLOVE) ×4
GLOVE ECLIPSE 6.5 STRL STRAW (GLOVE) ×6 IMPLANT
GLOVE ORTHO TXT STRL SZ7.5 (GLOVE) ×3 IMPLANT
GOWN STRL REUS W/ TWL LRG LVL3 (GOWN DISPOSABLE) ×1 IMPLANT
GOWN STRL REUS W/ TWL XL LVL3 (GOWN DISPOSABLE) ×1 IMPLANT
GOWN STRL REUS W/TWL LRG LVL3 (GOWN DISPOSABLE) ×3
GOWN STRL REUS W/TWL XL LVL3 (GOWN DISPOSABLE) ×3
HANDPIECE INTERPULSE COAX TIP (DISPOSABLE) ×3
IMMOBILIZER KNEE 22 UNIV (SOFTGOODS) ×3 IMPLANT
IMMOBILIZER KNEE 24 THIGH 36 (MISCELLANEOUS) IMPLANT
IMMOBILIZER KNEE 24 UNIV (MISCELLANEOUS)
KIT BASIN OR (CUSTOM PROCEDURE TRAY) ×3 IMPLANT
KIT ROOM TURNOVER OR (KITS) ×3 IMPLANT
KNEE/VIT E POLY LINER LEVEL 1B ×2 IMPLANT
MANIFOLD NEPTUNE II (INSTRUMENTS) ×3 IMPLANT
NDL 18GX1X1/2 (RX/OR ONLY) (NEEDLE) ×1 IMPLANT
NDL 25GX 5/8IN NON SAFETY (NEEDLE) ×1 IMPLANT
NEEDLE 18GX1X1/2 (RX/OR ONLY) (NEEDLE) ×3 IMPLANT
NEEDLE 25GX 5/8IN NON SAFETY (NEEDLE) ×3 IMPLANT
NS IRRIG 1000ML POUR BTL (IV SOLUTION) ×3 IMPLANT
PACK TOTAL JOINT (CUSTOM PROCEDURE TRAY) ×3 IMPLANT
PAD ARMBOARD 7.5X6 YLW CONV (MISCELLANEOUS) ×6 IMPLANT
PAD CAST 4YDX4 CTTN HI CHSV (CAST SUPPLIES) ×1 IMPLANT
PADDING CAST COTTON 4X4 STRL (CAST SUPPLIES) ×3
PADDING CAST COTTON 6X4 STRL (CAST SUPPLIES) ×3 IMPLANT
SET HNDPC FAN SPRY TIP SCT (DISPOSABLE) ×1 IMPLANT
SPONGE GAUZE 4X4 12PLY (GAUZE/BANDAGES/DRESSINGS) ×3 IMPLANT
STRIP CLOSURE SKIN 1/2X4 (GAUZE/BANDAGES/DRESSINGS) ×4 IMPLANT
SUCTION FRAZIER TIP 10 FR DISP (SUCTIONS) ×3 IMPLANT
SUT MNCRL AB 4-0 PS2 18 (SUTURE) ×3 IMPLANT
SUT VIC AB 0 CT1 27 (SUTURE)
SUT VIC AB 0 CT1 27XBRD ANBCTR (SUTURE) IMPLANT
SUT VIC AB 1 CT1 27 (SUTURE) ×6
SUT VIC AB 1 CT1 27XBRD ANBCTR (SUTURE) ×2 IMPLANT
SUT VIC AB 2-0 CT1 27 (SUTURE) ×6
SUT VIC AB 2-0 CT1 TAPERPNT 27 (SUTURE) ×2 IMPLANT
SYR 50ML LL SCALE MARK (SYRINGE) ×3 IMPLANT
SYR CONTROL 10ML LL (SYRINGE) ×3 IMPLANT
TOWEL OR 17X24 6PK STRL BLUE (TOWEL DISPOSABLE) ×3 IMPLANT
TOWEL OR 17X26 10 PK STRL BLUE (TOWEL DISPOSABLE) ×3 IMPLANT
WATER STERILE IRR 1000ML POUR (IV SOLUTION) ×2 IMPLANT

## 2013-10-02 NOTE — Op Note (Signed)
[name], [name]               ACCOUNT NO.:  [id number]  MEDICAL RECORD NO.:  [id number]  LOCATION:  [facility]                         FACILITY:  [facility]  PHYSICIAN:  Loreta Aveaniel F. Uriyah Raska, M.D. DATE OF BIRTH:  April 13, 1955  DATE OF PROCEDURE:  10/02/2013 DATE OF DISCHARGE:                              OPERATIVE REPORT   PREOPERATIVE DIAGNOSES:  Right knee end-stage degenerative arthritis with valgus contracture and lateral patellofemoral tracking tethering.  POSTOPERATIVE DIAGNOSES:  Right knee end-stage degenerative arthritis with valgus contracture and lateral patellofemoral tracking tethering.  PROCEDURE:  Right knee modified minimally invasive total knee replacement with Stryker Triathlon prosthesis.  Soft tissue balancing. Lateral retinacular release.  Cemented pegged posterior stabilized #5 femoral component.  Cemented #5 tibial component, 9 mm polyethylene insert.  Cemented resurfacing 32-mm patellar component.  SURGEON:  Loreta Aveaniel F. Dionicia Cerritos, M.D.  ASSISTANT:  Odelia GageLindsey Anton, PA, present throughout the entire case and necessary for timely completion of procedure.  ANESTHESIA:  General.  BLOOD LOSS:  Minimal.  SPECIMENS:  None.  CULTURES:  None.  COMPLICATIONS:  None.  DRESSINGS:  Soft compressive knee immobilizer.  DRAINS:  Hemovac x1.  TOURNIQUET TIME:  One hour.  PROCEDURE IN DETAIL:  The patient was brought to operating room, placed on the operating table in supine position.  After adequate anesthesia had been obtained, tourniquet applied.  Prepped and draped in usual sterile fashion.  Exsanguinated with elevation of Esmarch.  Tourniquet inflated to 350 mmHg.  Full extension, but a fixed 10 degrees of valgus with lateral tracking tethering.  Anterior incision above the patella down to tibial tubercle.  Medial arthrotomy, vastus splitting, preserving quad tendon.  Medial capsule release.  An 8 mm resection, distal femur flexible intramedullary guide, 5 degrees of  valgus.  Using epicondylar axis, the femur was sized, cut, and fitted for a pegged posterior stabilized #5 component.  Proximal tibial resection extramedullary guide.  Size 5 component as well.  Debris cleared throughout.  Patella exposed.  Posterior 10 mm removed.  Drilled, sized, and fitted for a 32-mm component.  Trials put in place.  I was happy with flexion, extension and balancing of the knee but there was persistent lateral patellofemoral tracking because of her contracture. I therefore did a lateral release which markedly improved tracking and I was very pleased with the completion.  Tibia was marked for rotation and hand reamed.  All trials have been removed.  Copious irrigation with a pulse irrigating device.  Cement prepared, placed on all components, firmly seated.  Polyethylene attached to tibia, knee reduced, patella with a clamp.  Once cement hardened, the knee was irrigated again.  Soft tissue was injected with Exparel.  Hemovac was placed.  Arthrotomy closed with #1 Vicryl, subcutaneous and subcuticular closure.  Margins were injected with Marcaine.  Sterile compressive dressing applied. Tourniquet deflated and removed.  Knee immobilizer applied.  Anesthesia reversed.  Brought to the recovery room.  Tolerated the surgery well with no complications.     Loreta Aveaniel F. Yaqub Arney, M.D.     DFM/MEDQ  D:  10/02/2013  T:  10/02/2013  Job:  [id number]

## 2013-10-02 NOTE — Anesthesia Procedure Notes (Addendum)
Procedure Name: Intubation Date/Time: 10/02/2013 9:44 AM Performed by: Leonel Ramsay'LAUGHLIN, Brittne Kawasaki H Pre-anesthesia Checklist: Patient identified, Timeout performed, Emergency Drugs available, Suction available and Patient being monitored Patient Re-evaluated:Patient Re-evaluated prior to inductionOxygen Delivery Method: Circle system utilized Preoxygenation: Pre-oxygenation with 100% oxygen Intubation Type: IV induction Laryngoscope Size: Mac and 4 Grade View: Grade I Tube type: Oral Tube size: 7.5 mm Airway Equipment and Method: Stylet Placement Confirmation: ETT inserted through vocal cords under direct vision,  positive ETCO2 and breath sounds checked- equal and bilateral Secured at: 22 cm Tube secured with: Tape Dental Injury: Teeth and Oropharynx as per pre-operative assessment

## 2013-10-02 NOTE — Anesthesia Preprocedure Evaluation (Signed)
Anesthesia Evaluation  Patient identified by MRN, date of birth, ID band Patient awake    Reviewed: Allergy & Precautions, H&P , NPO status , Patient's Chart, lab work & pertinent test results  History of Anesthesia Complications (+) PONV  Airway Mallampati: II  Neck ROM: full    Dental   Pulmonary former smoker,          Cardiovascular negative cardio ROS      Neuro/Psych    GI/Hepatic   Endo/Other  obese  Renal/GU      Musculoskeletal  (+) Arthritis -,   Abdominal   Peds  Hematology   Anesthesia Other Findings   Reproductive/Obstetrics                           Anesthesia Physical Anesthesia Plan  ASA: II  Anesthesia Plan: General   Post-op Pain Management:    Induction: Intravenous  Airway Management Planned: Oral ETT  Additional Equipment:   Intra-op Plan:   Post-operative Plan: Extubation in OR  Informed Consent: I have reviewed the patients History and Physical, chart, labs and discussed the procedure including the risks, benefits and alternatives for the proposed anesthesia with the patient or authorized representative who has indicated his/her understanding and acceptance.     Plan Discussed with: CRNA, Anesthesiologist and Surgeon  Anesthesia Plan Comments:         Anesthesia Quick Evaluation

## 2013-10-02 NOTE — Interval H&P Note (Signed)
History and Physical Interval Note:  10/02/2013 8:27 AM  Taylor Travis  has presented today for surgery, with the diagnosis of djd right knee  The various methods of treatment have been discussed with the patient and family. After consideration of risks, benefits and other options for treatment, the patient has consented to  Procedure(s): RIGHT TOTAL KNEE ARTHROPLASTY (Right) as a surgical intervention .  The patient's history has been reviewed, patient examined, no change in status, stable for surgery.  I have reviewed the patient's chart and labs.  Questions were answered to the patient's satisfaction.     Wanita Derenzo F

## 2013-10-02 NOTE — H&P (View-Only) (Signed)
TOTAL KNEE ADMISSION H&P  Patient is being admitted for right total knee arthroplasty.  Subjective:  Chief Complaint:right knee pain.  HPI: Taylor Travis, 58 y.o. female, has a history of pain and functional disability in the right knee due to arthritis and has failed non-surgical conservative treatments for greater than 12 weeks to includeNSAID's and/or analgesics, corticosteriod injections and activity modification.  Onset of symptoms was gradual, starting 2 years ago with gradually worsening course since that time. The patient noted prior procedures on the knee to include  arthroscopy and menisectomy on the right knee(s).  Patient currently rates pain in the right knee(s) at 8 out of 10 with activity. Patient has night pain, worsening of pain with activity and weight bearing, pain that interferes with activities of daily living, pain with passive range of motion, crepitus and joint swelling.  Patient has evidence of subchondral cysts, subchondral sclerosis and joint space narrowing by imaging studies. There is no active infection.  There are no active problems to display for this patient.  No past medical history on file.  Past Surgical History  Procedure Laterality Date  . Achilles tendon repair    . Back surgery    . Shoulder surgery    . Cesarean section       (Not in a hospital admission) Allergies  Allergen Reactions  . Oxycodone Nausea And Vomiting    History  Substance Use Topics  . Smoking status: Former Smoker  . Smokeless tobacco: Not on file  . Alcohol Use: Yes    No family history on file.   Review of Systems  Constitutional: Negative.   HENT: Negative.   Eyes: Negative.   Respiratory: Negative.   Cardiovascular: Negative.   Gastrointestinal: Negative.   Genitourinary: Negative.   Musculoskeletal: Positive for joint pain.  Skin: Negative.   Neurological: Negative.   Endo/Heme/Allergies: Negative.   Psychiatric/Behavioral: Negative.      Objective:  Physical Exam  Constitutional: She is oriented to person, place, and time. She appears well-developed and well-nourished.  HENT:  Head: Normocephalic and atraumatic.  Eyes: EOM are normal.  Neck: Normal range of motion. Neck supple.  Cardiovascular: Normal rate, regular rhythm and normal heart sounds.  Exam reveals no gallop and no friction rub.   No murmur heard. Respiratory: Effort normal. No respiratory distress. She has no wheezes. She has no rales.  GI: Soft. Bowel sounds are normal. She exhibits no distension.  Musculoskeletal:  Antalgic gait, valgus thrust on the right.  Negative straight leg raise both sides.  Negative log roll of both hips.  Right knee motion 5-90 with more than ten degrees of valgus.  The left is 0-100.  In a little bit of varus.  Profound patellofemoral crepitus, both sides.  Neurovascularly intact distally.    Neurological: She is alert and oriented to person, place, and time.  Skin: Skin is warm and dry.  Psychiatric: She has a normal mood and affect. Her behavior is normal. Judgment and thought content normal.    Vital signs in last 24 hours: @VSRANGES@  Labs:   There is no height or weight on file to calculate BMI.   Imaging Review Plain radiographs demonstrate severe degenerative joint disease of the right knee(s). The overall alignment ismild varus. The bone quality appears to be fair for age and reported activity level.  Assessment/Plan:  End stage arthritis, right knee   The patient history, physical examination, clinical judgment of the provider and imaging studies are consistent with end stage   degenerative joint disease of the right knee(s) and total knee arthroplasty is deemed medically necessary. The treatment options including medical management, injection therapy arthroscopy and arthroplasty were discussed at length. The risks and benefits of total knee arthroplasty were presented and reviewed. The risks due to aseptic  loosening, infection, stiffness, patella tracking problems, thromboembolic complications and other imponderables were discussed. The patient acknowledged the explanation, agreed to proceed with the plan and consent was signed. Patient is being admitted for inpatient treatment for surgery, pain control, PT, OT, prophylactic antibiotics, VTE prophylaxis, progressive ambulation and ADL's and discharge planning. The patient is planning to be discharged home with home health services

## 2013-10-02 NOTE — Transfer of Care (Signed)
Immediate Anesthesia Transfer of Care Note  Patient: Taylor Travis  Procedure(s) Performed: Procedure(s): RIGHT TOTAL KNEE ARTHROPLASTY (Right)  Patient Location: PACU  Anesthesia Type:General  Level of Consciousness: awake, alert  and oriented  Airway & Oxygen Therapy: Patient Spontanous Breathing and Patient connected to nasal cannula oxygen  Post-op Assessment: Report given to PACU RN and Post -op Vital signs reviewed and stable  Post vital signs: Reviewed and stable  Complications: No apparent anesthesia complications

## 2013-10-02 NOTE — Discharge Summary (Addendum)
Patient ID: Taylor Travis MRN: [id number] DOB/AGE: 04/12/1955 58 y.o.  Admit date: 10/02/2013 Discharge date: 10/04/2013  Admission Diagnoses:  Active Problems:   DJD (degenerative joint disease) of knee   Discharge Diagnoses:  Same  Past Medical History  Diagnosis Date  . PONV (postoperative nausea and vomiting)   . Heart murmur   . Arthritis     Surgeries: Procedure(s): RIGHT TOTAL KNEE ARTHROPLASTY on 10/02/2013   Consultants:    Discharged Condition: Improved  Hospital Course: Taylor Travis is an 58 y.o. female who was admitted 10/02/2013 for operative treatment of right knee end stage degenerative joint disease. Patient has severe unremitting pain that affects sleep, daily activities, and work/hobbies. After pre-op clearance the patient was taken to the operating room on 10/02/2013 and underwent  Procedure(s): RIGHT TOTAL KNEE ARTHROPLASTY.  Pod#1 patient developed ABLA with a hgb of 11.4 and today with a hgb of 10.7.  Patient asymptomatic.  Will continue to follow.  Patient was given perioperative antibiotics:     Anti-infectives   Start     Dose/Rate Route Frequency Ordered Stop   10/02/13 1730  ceFAZolin (ANCEF) IVPB 2 g/50 mL premix     2 g 100 mL/hr over 30 Minutes Intravenous Every 6 hours 10/02/13 1637 10/02/13 2342   10/02/13 0600  ceFAZolin (ANCEF) 3 g in dextrose 5 % 50 mL IVPB     3 g 160 mL/hr over 30 Minutes Intravenous On call to O.R. 10/01/13 1422 10/02/13 0945       Patient was given sequential compression devices, early ambulation, and chemoprophylaxis to prevent DVT.  Patient benefited maximally from hospital stay and there were no complications.    Recent vital signs:  Patient Vitals for the past 24 hrs:  BP Temp Temp src Pulse Resp SpO2  10/04/13 0500 128/69 mmHg 97.9 F (36.6 C) Oral 66 18 96 %  10/04/13 0400 - - - - 17 -  10/04/13 0000 - - - - 16 -  10/03/13 2106 134/55 mmHg 98.5 F (36.9 C) Oral 95 18 98 %  10/03/13 2000 - - - - 18  98 %  10/03/13 1448 135/54 mmHg 97.7 F (36.5 C) Oral 91 16 96 %     Recent laboratory studies:   Recent Labs  10/03/13 0500 10/04/13 0603  WBC 9.6 14.3*  HGB 11.4* 10.7*  HCT 34.9* 33.2*  PLT 176 202  NA 136* 141  K 4.8 4.5  CL 101 103  CO2 23 26  BUN 11 13  CREATININE 0.71 0.64  GLUCOSE 151* 155*  CALCIUM 8.8 8.8     Discharge Medications:     Medication List    STOP taking these medications       acetaminophen 650 MG CR tablet  Commonly known as:  TYLENOL      TAKE these medications       aspirin EC 325 MG tablet  Take 1 tablet (325 mg total) by mouth daily.     bisacodyl 5 MG EC tablet  Commonly known as:  DULCOLAX  Take 1 tablet (5 mg total) by mouth daily as needed for moderate constipation.     HYDROcodone-acetaminophen 7.5-325 MG per tablet  Commonly known as:  NORCO  Take 1-2 tablets by mouth every 4 (four) hours as needed for moderate pain.     meclizine 50 MG tablet  Commonly known as:  ANTIVERT  Take 50 mg by mouth 3 (three) times daily as needed for dizziness or nausea.  methocarbamol 500 MG tablet  Commonly known as:  ROBAXIN  Take 1 tablet (500 mg total) by mouth 4 (four) times daily.     ondansetron 4 MG tablet  Commonly known as:  ZOFRAN  Take 1 tablet (4 mg total) by mouth every 8 (eight) hours as needed for nausea or vomiting.        Diagnostic Studies: Dg Chest 2 View  09/24/2013   CLINICAL DATA:  Preoperative films.  EXAM: CHEST  2 VIEW  COMPARISON:  PA and lateral chest 08/27/2012.  FINDINGS: Lungs clear. Heart size normal. No pneumothorax or pleural effusion. Mild thoracic spondylosis is noted.  IMPRESSION: No acute disease.   Electronically Signed   By: Drusilla Kannerhomas  Dalessio M.D.   On: 09/24/2013 15:12   Dg Knee Right Port  10/02/2013   CLINICAL DATA:  Postoperative from right knee arthroplasty  EXAM: PORTABLE RIGHT KNEE - 1-2 VIEW  COMPARISON:  None.  FINDINGS: The patient has undergone right total knee joint prosthesis  placement. Radiographic positioning of the prosthetic components is good. The interface with the native bone is normal. A surgical drain line is present.  IMPRESSION: The there is no immediate postprocedure complication following right total knee joint replacement.   Electronically Signed   By: David  Swazil[place]   On: 10/02/2013 12:57    Disposition: 01-Home or Self Care  Discharge Instructions   CPM    Complete by:  As directed   Continuous passive motion machine (CPM):      Use the CPM from 0- to 60 for 6 hours per day.      You may increase by 10 per day.  You may break it up into 2 or 3 sessions per day.      Use CPM for 2-3 weeks or until you are told to stop.     Call MD / Call 911    Complete by:  As directed   If you experience chest pain or shortness of breath, CALL 911 and be transported to the hospital emergency room.  If you develope a fever above 101 F, pus (white drainage) or increased drainage or redness at the wound, or calf pain, call your surgeon's office.     Change dressing    Complete by:  As directed   Change dressing on Saturday, then change the dressing daily with sterile 4 x 4 inch gauze dressing and apply TED hose.  You may clean the incision with alcohol prior to redressing.     Constipation Prevention    Complete by:  As directed   Drink plenty of fluids.  Prune juice may be helpful.  You may use a stool softener, such as Colace (over the counter) 100 mg twice a day.  Use MiraLax (over the counter) for constipation as needed.     Diet - low sodium heart healthy    Complete by:  As directed      Discharge instructions    Complete by:  As directed   Weight bearing as tolerated.  Take Aspirin 1 tab a day for the next 30 days to prevent blood clots.  Change dressing daily starting on Saturday.  May shower on Monday, but do not soak incision.  May apply ice for up to 20 minutes at a time for pain and swelling.  Follow up appointment in our office in two weeks.     Do not  put a pillow under the knee. Place it under the heel.    Complete  by:  As directed   Place gray foam under operative heel when in bed or in a chair to work on extension     Increase activity slowly as tolerated    Complete by:  As directed      TED hose    Complete by:  As directed   Use stockings (TED hose) for 2 weeks on both leg(s).  You may remove them at night for sleeping.           Follow-up Information   Follow up with [facility] F, MD. Schedule an appointment as soon as possible for a visit in 2 weeks.   Specialty:  Orthopedic Surgery   Contact information:   [address] [place] [address] [phone]       Follow up with [facility]. (Someone from [facility] will contact you concerning start date and time for physical; therapy.)    Contact information:   [address] [place] [address] [phone]        Signed: Gearldine Shown 10/04/2013, 7:26 AM

## 2013-10-02 NOTE — Discharge Instructions (Signed)
Total Knee Replacement °Care After °Refer to this sheet in the next few weeks. These instructions provide you with information on caring for yourself after your procedure. Your caregiver also may give you specific instructions. Your treatment has been planned according to the most current medical practices, but problems sometimes occur. Call your caregiver if you have any problems or questions after your procedure. °HOME CARE INSTRUCTIONS  ° °Weight bearing as tolerated.  Take Aspirin 1 tab a day for the next 30 days to prevent blood clots.  Change dressing daily starting on Saturday.  May shower on Monday, but do not soak incision.  May apply ice for up to 20 minutes at a time for pain and swelling.  Follow up appointment in two weeks.  ° °· See a physical therapist as directed by your caregiver. °· Take over-the-counter or prescription medicines for pain, discomfort, or fever only as directed by your caregiver. °· Avoid lifting or driving until you are instructed otherwise. °· If you have been sent home with a continuous passive motion machine, use it as directed by your caregiver. °SEEK MEDICAL CARE IF: °· You have difficulty breathing. °· Your wound is red, swollen, or has become increasingly painful. °· You have pus draining from your wound. °· You have a bad smell coming from your wound. °· You have persistent bleeding from your wound. °· Your wound breaks open after sutures (stitches) or staples have been removed. °SEEK IMMEDIATE MEDICAL CARE IF:  °· You have a fever. °· You have a rash. °· You have pain or swelling in your calf or thigh. °· You have shortness of breath or chest pain. °· Your range of motion in your knee is decreasing rather than increasing. °MAKE SURE YOU:  °· Understand these instructions. °· Will watch your condition. °· Will get help right away if you are not doing well or get worse. °Document Released: 09/17/2004 Document Revised: 08/30/2011 Document Reviewed: 04/19/2011 °[employer]®  Patient Information ©2015 [employer]. This information is not intended to replace advice given to you by your health care provider. Make sure you discuss any questions you have with your health care provider. ° °

## 2013-10-02 NOTE — Anesthesia Postprocedure Evaluation (Signed)
Anesthesia Post Note  Patient: Taylor Travis  Procedure(s) Performed: Procedure(s) (LRB): RIGHT TOTAL KNEE ARTHROPLASTY (Right)  Anesthesia type: General  Patient location: PACU  Post pain: Pain level controlled and Adequate analgesia  Post assessment: Post-op Vital signs reviewed, Patient's Cardiovascular Status Stable, Respiratory Function Stable, Patent Airway and Pain level controlled  Last Vitals:  Filed Vitals:   10/02/13 1300  BP:   Pulse: 65  Temp:   Resp: 10    Post vital signs: Reviewed and stable  Level of consciousness: awake, alert  and oriented  Complications: No apparent anesthesia complications

## 2013-10-03 ENCOUNTER — Encounter (HOSPITAL_COMMUNITY): Payer: Self-pay | Admitting: Orthopedic Surgery

## 2013-10-03 LAB — BASIC METABOLIC PANEL
ANION GAP: 12 (ref 5–15)
BUN: 11 mg/dL (ref 6–23)
CALCIUM: 8.8 mg/dL (ref 8.4–10.5)
CHLORIDE: 101 meq/L (ref 96–112)
CO2: 23 mEq/L (ref 19–32)
CREATININE: 0.71 mg/dL (ref 0.50–1.10)
GFR calc Af Amer: >90 mL/min (ref >90)
GFR calc non Af Amer: >90 mL/min (ref >90)
GLUCOSE: 151 mg/dL — AB (ref 70–99)
Potassium: 4.8 mEq/L (ref 3.7–5.3)
Sodium: 136 mEq/L — ABNORMAL LOW (ref 137–147)

## 2013-10-03 LAB — CBC
HCT: 34.9 % — ABNORMAL LOW (ref 36.0–46.0)
Hemoglobin: 11.4 g/dL — ABNORMAL LOW (ref 12.0–15.0)
MCH: 28 pg (ref 26.0–34.0)
MCHC: 32.7 g/dL (ref 30.0–36.0)
MCV: 85.7 fL (ref 78.0–100.0)
PLATELETS: 176 10*3/uL (ref 150–400)
RBC: 4.07 MIL/uL (ref 3.87–5.11)
RDW: 13.3 % (ref 11.5–15.5)
WBC: 9.6 10*3/uL (ref 4.0–10.5)

## 2013-10-03 NOTE — Evaluation (Signed)
Physical Therapy Evaluation Patient Details Name: Taylor Travis MRN: [id number] DOB: May 18, 1955 Today's Date: 10/03/2013   History of Present Illness  pt presents with R TKA.    Clinical Impression  Pt motivated and moving well.  Pt has all needed DME, but will need continued PT at home.  Will continue to follow.      Follow Up Recommendations Home health PT;Supervision for mobility/OOB    Equipment Recommendations  None recommended by PT    Recommendations for Other Services       Precautions / Restrictions Precautions Precautions: Fall;Knee Precaution Booklet Issued: Yes (comment) Required Braces or Orthoses: Knee Immobilizer - Right Knee Immobilizer - Right: On when out of bed or walking;Discontinue once straight leg raise with < 10 degree lag Restrictions Weight Bearing Restrictions: Yes RLE Weight Bearing: Weight bearing as tolerated      Mobility  Bed Mobility Overal bed mobility: Needs Assistance Bed Mobility: Supine to Sit     Supine to sit: Min assist;HOB elevated     General bed mobility comments: A with R LE only.    Transfers Overall transfer level: Needs assistance Equipment used: Rolling walker (2 wheeled) Transfers: Sit to/from Stand Sit to Stand: Min guard         General transfer comment: cues for UE use and controlling descent to sitting.    Ambulation/Gait Ambulation/Gait assistance: Min guard Ambulation Distance (Feet): 60 Feet Assistive device: Rolling walker (2 wheeled) Gait Pattern/deviations: Step-to pattern;Decreased step length - left;Decreased stance time - right;Decreased stride length     General Gait Details: cues for upright posture and gait sequencing.  pt needed 2 standing rest breaks.    Stairs            Wheelchair Mobility    Modified Rankin (Stroke Patients Only)       Balance Overall balance assessment: Needs assistance         Standing balance support: Bilateral upper extremity  supported Standing balance-Leahy Scale: Poor                               Pertinent Vitals/Pain 7/10 during ROM.  Premedicated.      Home Living Family/patient expects to be discharged to:: Private residence Living Arrangements: Spouse/significant other Available Help at Discharge: Family;Available 24 hours/day Type of Home: House Home Access: Stairs to enter Entrance Stairs-Rails: None Entrance Stairs-Number of Steps: 2-3 Home Layout: One level Home Equipment: Walker - 2 wheels;Walker - 4 wheels;Toilet riser;Cane - single point;Crutches      Prior Function Level of Independence: Independent               Hand Dominance        Extremity/Trunk Assessment   Upper Extremity Assessment: Defer to OT evaluation           Lower Extremity Assessment: RLE deficits/detail RLE Deficits / Details: Generally weak pos-op.      Cervical / Trunk Assessment: Normal  Communication   Communication: No difficulties  Cognition Arousal/Alertness: Awake/alert Behavior During Therapy: WFL for tasks assessed/performed Overall Cognitive Status: Within Functional Limits for tasks assessed                      General Comments      Exercises Total Joint Exercises Ankle Circles/Pumps: AROM;Both;10 reps Quad Sets: AROM;Both;10 reps Long Arc Quad: AAROM;Right;10 reps Knee Flexion: AAROM;Right;10 reps      Assessment/Plan  PT Assessment Patient needs continued PT services  PT Diagnosis Abnormality of gait;Acute pain   PT Problem List Decreased strength;Decreased range of motion;Decreased activity tolerance;Decreased balance;Decreased mobility;Decreased knowledge of use of DME;Pain  PT Treatment Interventions DME instruction;Gait training;Stair training;Functional mobility training;Therapeutic activities;Therapeutic exercise;Balance training;Patient/family education   PT Goals (Current goals can be found in the Care Plan section) Acute Rehab PT  Goals Patient Stated Goal: Son's wedding in 2 weeks.   PT Goal Formulation: With patient Time For Goal Achievement: 10/10/13 Potential to Achieve Goals: Good    Frequency 7X/week   Barriers to discharge        Co-evaluation               End of Session Equipment Utilized During Treatment: Gait belt;Right knee immobilizer Activity Tolerance: Patient tolerated treatment well Patient left: in chair;with call bell/phone within reach Nurse Communication: Mobility status         Time: [id number] PT Time Calculation (min): 35 min   Charges:   PT Evaluation $Initial PT Evaluation Tier I: 1 Procedure PT Treatments $Gait Training: 8-22 mins $Therapeutic Exercise: 8-22 mins   PT G CodesSunny Schlein:          Lenora Gomes F, [place]PT [phone] 10/03/2013, 10:46 AM

## 2013-10-03 NOTE — Progress Notes (Signed)
Subjective: 1 Day Post-Op Procedure(s) (LRB): RIGHT TOTAL KNEE ARTHROPLASTY (Right) Patient reports pain as 3 on 0-10 scale.  Patient c/o dizziness since yesterday due to vertigo.  This has however improved.  Minimal nausea, no vomiting.  Positive flatus but no bm as of yet.  Objective: Vital signs in last 24 hours: Temp:  [97.5 F (36.4 C)-98.2 F (36.8 C)] 98.2 F (36.8 C) (07/22 2017) Pulse Rate:  [64-94] 90 (07/22 2017) Resp:  [8-20] 15 (07/22 2017) BP: (124-151)/(60-94) 144/60 mmHg (07/22 2017) SpO2:  [91 %-100 %] 94 % (07/22 2017) Weight:  [120.657 kg (266 lb)] 120.657 kg (266 lb) (07/22 0818)  Intake/Output from previous day: 07/22 0701 - 07/23 0700 In: 830 [I.V.:800] Out: 1425 [Urine:1000; Drains:400; Blood:25] Intake/Output this shift: Total I/O In: -  Out: 1300 [Urine:900; Drains:400]  No results found for this basename: HGB,  in the last 72 hours No results found for this basename: WBC, RBC, HCT, PLT,  in the last 72 hours No results found for this basename: NA, K, CL, CO2, BUN, CREATININE, GLUCOSE, CALCIUM,  in the last 72 hours No results found for this basename: LABPT, INR,  in the last 72 hours  Neurologically intact Neurovascular intact Sensation intact distally Intact pulses distally Dorsiflexion/Plantar flexion intact Compartment soft Negative homen's bilaterally hemovac drain pulled by me today  Assessment/Plan: 1 Day Post-Op Procedure(s) (LRB): RIGHT TOTAL KNEE ARTHROPLASTY (Right) Advance diet Up with therapy D/C IV fluids Discharge home with home health most likely tomorrow, but knows she can go home today if she feels comfortable after working with PT. WBAT RLE  Gearldine ShownNTON, M. LINDSEY 10/03/2013, 5:50 AM

## 2013-10-03 NOTE — Care Management Note (Signed)
CARE MANAGEMENT NOTE 10/03/2013  Patient:  Taylor Travis,Taylor Travis   Account Number:  [id number]  Date Initiated:  10/03/2013  Documentation initiated by:  Vance PeperBRADY,Burch Marchuk  Subjective/Objective Assessment:   58 yr old female s/p right total knee arthroplasty.     Action/Plan:   [facility], no changes. Patient has family support at discharge.Has rolling walker and 3in1.   Anticipated DC Date:  10/04/2013   Anticipated DC Plan:  HOME W HOME HEALTH SERVICES      DC Planning Services  CM consult      [facility]  [facility]  DURABLE MEDICAL EQUIPMENT   Choice offered to / List presented to:  C-1 Patient   DME arranged  CPM      DME agency  [employer]     HH arranged  HH-2 PT      [facility]   Status of service:  Completed, signed off Medicare Important Message given?   (If response is "NO", the following Medicare IM given date fields will be blank) Date Medicare IM given:   Medicare IM given by:   Date Additional Medicare IM given:   Additional Medicare IM given by:    Discharge Disposition:  HOME W HOME HEALTH SERVICES  Per UR Regulation:  Reviewed for med. necessity/level of care/duration of stay  If discussed at Long Length of Stay Meetings, dates discussed:    Comments:

## 2013-10-03 NOTE — Progress Notes (Signed)
OT Cancellation Note  Patient Details Name: Taylor Travis MRN: [id number] DOB: Sep 29, 1955   Cancelled Treatment:    Reason Eval/Treat Not Completed: OT screened, no needs identified, will sign off. Pt has needed A at home and all needed DME.  Taylor Travis, Taylor Travis [phone] 10/03/2013, 1:43 PM

## 2013-10-03 NOTE — Progress Notes (Signed)
Orthopedic Tech Progress Note Patient Details:  Otto HerbLucinda B Tippett 1955/04/26 [id number] On cpm at 7:27 pm RLE 0-70 Patient ID: Otto HerbLucinda B Zenor, female   DOB: 1955/04/26, 58 y.o.   MRN: [id number]   Jennye MoccasinHughes, Abdulahad Mederos Craig 10/03/2013, 7:27 PM

## 2013-10-03 NOTE — Progress Notes (Signed)
Physical Therapy Treatment Patient Details Name: Taylor Travis MRN: [id number] DOB: Oct 13, 1955 Today's Date: 10/03/2013    History of Present Illness pt presents with R TKA.      PT Comments    Pt moving well and able to increase amb distance this session.  Pt indicates plan is to D/C tomorrow to home.  Will trial stairs tomorrow.    Follow Up Recommendations  Home health PT;Supervision for mobility/OOB     Equipment Recommendations  None recommended by PT    Recommendations for Other Services       Precautions / Restrictions Precautions Precautions: Fall;Knee Precaution Booklet Issued: Yes (comment) Required Braces or Orthoses: Knee Immobilizer - Right Knee Immobilizer - Right: On when out of bed or walking;Discontinue once straight leg raise with < 10 degree lag Restrictions Weight Bearing Restrictions: Yes RLE Weight Bearing: Weight bearing as tolerated    Mobility  Bed Mobility Overal bed mobility: Needs Assistance Bed Mobility: Supine to Sit;Sit to Supine     Supine to sit: Supervision;HOB elevated Sit to supine: Supervision;HOB elevated   General bed mobility comments: With increased time pt able to perform mobility without physical A.    Transfers Overall transfer level: Needs assistance Equipment used: Rolling walker (2 wheeled) Transfers: Sit to/from Stand Sit to Stand: Min guard         General transfer comment: cues for UE use and controlling descent to sitting.    Ambulation/Gait Ambulation/Gait assistance: Min guard Ambulation Distance (Feet): 120 Feet Assistive device: Rolling walker (2 wheeled) Gait Pattern/deviations: Step-to pattern;Decreased step length - left;Decreased stance time - right;Decreased stride length     General Gait Details: pt able to increase amb distance this session.  cues for increased step length on L LE.     Stairs            Wheelchair Mobility    Modified Rankin (Stroke Patients Only)        Balance Overall balance assessment: Needs assistance         Standing balance support: Bilateral upper extremity supported Standing balance-Leahy Scale: Poor                      Cognition Arousal/Alertness: Awake/alert Behavior During Therapy: WFL for tasks assessed/performed Overall Cognitive Status: Within Functional Limits for tasks assessed                      Exercises Total Joint Exercises Ankle Circles/Pumps: AROM;Both;10 reps Quad Sets: AROM;Both;10 reps Long Arc Quad: AAROM;Right;10 reps Knee Flexion: AAROM;Right;10 reps Goniometric ROM: ~10 - 80    General Comments        Pertinent Vitals/Pain 5/10 with ROM.  Premedicated.      Home Living Family/patient expects to be discharged to:: Private residence Living Arrangements: Spouse/significant other Available Help at Discharge: Family;Available 24 hours/day Type of Home: House Home Access: Stairs to enter Entrance Stairs-Rails: None Home Layout: One level Home Equipment: Environmental consultant - 2 wheels;Walker - 4 wheels;Toilet riser;Cane - single point;Crutches      Prior Function Level of Independence: Independent          PT Goals (current goals can now be found in the care plan section) Acute Rehab PT Goals Patient Stated Goal: Son's wedding in 2 weeks.   PT Goal Formulation: With patient Time For Goal Achievement: 10/10/13 Potential to Achieve Goals: Good Progress towards PT goals: Progressing toward goals    Frequency  7X/week    PT  Plan Current plan remains appropriate    Co-evaluation             End of Session Equipment Utilized During Treatment: Gait belt;Right knee immobilizer Activity Tolerance: Patient tolerated treatment well Patient left: in bed;with call bell/phone within reach;with family/visitor present     Time: 1100-1126 PT Time Calculation (min): 26 min  Charges:  $Gait Training: 8-22 mins $Therapeutic Exercise: 8-22 mins                    G CodesSunny Schlein:       Marwin Primmer F, [place] CarolinaPT [phone] 10/03/2013, 12:07 PM

## 2013-10-04 ENCOUNTER — Encounter (HOSPITAL_COMMUNITY): Payer: Self-pay | Admitting: General Practice

## 2013-10-04 LAB — CBC
HEMATOCRIT: 33.2 % — AB (ref 36.0–46.0)
HEMOGLOBIN: 10.7 g/dL — AB (ref 12.0–15.0)
MCH: 28.1 pg (ref 26.0–34.0)
MCHC: 32.2 g/dL (ref 30.0–36.0)
MCV: 87.1 fL (ref 78.0–100.0)
Platelets: 202 10*3/uL (ref 150–400)
RBC: 3.81 MIL/uL — ABNORMAL LOW (ref 3.87–5.11)
RDW: 13.5 % (ref 11.5–15.5)
WBC: 14.3 10*3/uL — AB (ref 4.0–10.5)

## 2013-10-04 LAB — BASIC METABOLIC PANEL
Anion gap: 12 (ref 5–15)
BUN: 13 mg/dL (ref 6–23)
CHLORIDE: 103 meq/L (ref 96–112)
CO2: 26 mEq/L (ref 19–32)
CREATININE: 0.64 mg/dL (ref 0.50–1.10)
Calcium: 8.8 mg/dL (ref 8.4–10.5)
GFR calc Af Amer: >90 mL/min (ref >90)
GFR calc non Af Amer: >90 mL/min (ref >90)
Glucose, Bld: 155 mg/dL — ABNORMAL HIGH (ref 70–99)
Potassium: 4.5 mEq/L (ref 3.7–5.3)
Sodium: 141 mEq/L (ref 137–147)

## 2013-10-04 NOTE — Progress Notes (Signed)
Subjective: 2 Days Post-Op Procedure(s) (LRB): RIGHT TOTAL KNEE ARTHROPLASTY (Right) Patient reports pain as 2 on 0-10 scale.  Vertigo and dizziness have subsided and Taylor Travis is doing much better overall.  She is very motivated to progress due to son's upcoming wedding in two weeks.  No nausea/vomiting.  Positive flatus but no bm as of yet.  Tolerating diet.    Objective: Vital signs in last 24 hours: Temp:  [97.7 F (36.5 C)-98.5 F (36.9 C)] 97.9 F (36.6 C) (07/24 0500) Pulse Rate:  [66-95] 66 (07/24 0500) Resp:  [16-18] 18 (07/24 0500) BP: (128-135)/(54-69) 128/69 mmHg (07/24 0500) SpO2:  [96 %-98 %] 96 % (07/24 0500)  Intake/Output from previous day: 07/23 0701 - 07/24 0700 In: 120 [P.O.:120] Out: -  Intake/Output this shift:     Recent Labs  10/03/13 0500 10/04/13 0603  HGB 11.4* 10.7*    Recent Labs  10/03/13 0500 10/04/13 0603  WBC 9.6 14.3*  RBC 4.07 3.81*  HCT 34.9* 33.2*  PLT 176 202    Recent Labs  10/03/13 0500 10/04/13 0603  NA 136* 141  K 4.8 4.5  CL 101 103  CO2 23 26  BUN 11 13  CREATININE 0.71 0.64  GLUCOSE 151* 155*  CALCIUM 8.8 8.8   No results found for this basename: LABPT, INR,  in the last 72 hours  Neurologically intact Neurovascular intact Sensation intact distally Intact pulses distally Dorsiflexion/Plantar flexion intact Incision: scant drainage No cellulitis present Compartment soft Negative homen's bilaterally Dressing changed by me today  Assessment/Plan: 2 Days Post-Op Procedure(s) (LRB): RIGHT TOTAL KNEE ARTHROPLASTY (Right) Advance diet Up with therapy Discharge home with home health WBAT RLE  Taylor Travis, M. LINDSEY 10/04/2013, 7:24 AM

## 2013-10-04 NOTE — Progress Notes (Signed)
Physical Therapy Treatment Patient Details Name: Taylor Travis MRN: [id number] DOB: 12-Aug-1955 Today's Date: 10/04/2013    History of Present Illness pt presents with R TKA.      PT Comments    Pt doing great.  Pt ready for D/C from PT stand point.  RN made aware pt set-up in bathroom at sink to wash up with pull cord near by.    Follow Up Recommendations  Home health PT;Supervision for mobility/OOB     Equipment Recommendations  None recommended by PT    Recommendations for Other Services       Precautions / Restrictions Precautions Precautions: Fall;Knee Precaution Booklet Issued: Yes (comment) Required Braces or Orthoses: Knee Immobilizer - Right Knee Immobilizer - Right: On when out of bed or walking;Discontinue once straight leg raise with < 10 degree lag Restrictions Weight Bearing Restrictions: Yes RLE Weight Bearing: Weight bearing as tolerated    Mobility  Bed Mobility Overal bed mobility: Modified Independent                Transfers Overall transfer level: Needs assistance Equipment used: Rolling walker (2 wheeled) Transfers: Sit to/from Stand Sit to Stand: Supervision         General transfer comment: cues to get closer to recliner/3-in-1 prior to sitting.    Ambulation/Gait Ambulation/Gait assistance: Supervision Ambulation Distance (Feet): 250 Feet Assistive device: Rolling walker (2 wheeled) Gait Pattern/deviations: Step-through pattern;Decreased step length - left;Decreased stance time - right     General Gait Details: pt with more fluid gait pattern and working on increased step length on L LE.     Stairs Stairs: Yes Stairs assistance: Min assist Stair Management: No rails;Step to pattern;Backwards;With walker Number of Stairs: 2 General stair comments: cues for gait sequencing and safety on stairs.    Wheelchair Mobility    Modified Rankin (Stroke Patients Only)       Balance                                     Cognition Arousal/Alertness: Awake/alert Behavior During Therapy: WFL for tasks assessed/performed Overall Cognitive Status: Within Functional Limits for tasks assessed                      Exercises Total Joint Exercises Straight Leg Raises: AROM;Right;10 reps    General Comments        Pertinent Vitals/Pain 3-4/10.  Premedicated.      Home Living                      Prior Function            PT Goals (current goals can now be found in the care plan section) Acute Rehab PT Goals Patient Stated Goal: Son's wedding in 2 weeks.   PT Goal Formulation: With patient Time For Goal Achievement: 10/10/13 Potential to Achieve Goals: Good Progress towards PT goals: Progressing toward goals    Frequency  7X/week    PT Plan Current plan remains appropriate    Co-evaluation             End of Session Equipment Utilized During Treatment: Gait belt Activity Tolerance: Patient tolerated treatment well Patient left:  (on 3-in-1 at bathroom sink with pull cord.  )     Time: 4098-1191 PT Time Calculation (min): 27 min  Charges:  $Gait Training: 23-37 mins  G CodesSunny Schlein:      Oveda Dadamo F, [place] [phone] 10/04/2013, 11:45 AM

## 2013-10-15 ENCOUNTER — Other Ambulatory Visit: Payer: Self-pay

## 2013-10-15 DIAGNOSIS — Z1231 Encounter for screening mammogram for malignant neoplasm of breast: Secondary | ICD-10-CM

## 2013-11-06 ENCOUNTER — Ambulatory Visit
Admission: RE | Admit: 2013-11-06 | Discharge: 2013-11-06 | Disposition: A | Discharge status: Home / Self Care | Payer: BC Managed Care – PPO | Source: Ambulatory Visit

## 2013-11-06 DIAGNOSIS — Z1231 Encounter for screening mammogram for malignant neoplasm of breast: Secondary | ICD-10-CM

## 2013-11-22 ENCOUNTER — Ambulatory Visit (HOSPITAL_COMMUNITY): Payer: BC Managed Care – PPO | Attending: Physician Assistant | Admitting: Cardiology

## 2013-11-22 ENCOUNTER — Other Ambulatory Visit (HOSPITAL_COMMUNITY): Payer: Self-pay | Admitting: Cardiology

## 2013-11-22 DIAGNOSIS — Z87891 Personal history of nicotine dependence: Secondary | ICD-10-CM | POA: Diagnosis not present

## 2013-11-22 DIAGNOSIS — M7989 Other specified soft tissue disorders: Secondary | ICD-10-CM | POA: Diagnosis not present

## 2013-11-22 DIAGNOSIS — M79609 Pain in unspecified limb: Secondary | ICD-10-CM | POA: Diagnosis not present

## 2013-11-22 DIAGNOSIS — M79604 Pain in right leg: Secondary | ICD-10-CM

## 2013-11-22 DIAGNOSIS — M79661 Pain in right lower leg: Secondary | ICD-10-CM

## 2013-11-22 DIAGNOSIS — R29898 Other symptoms and signs involving the musculoskeletal system: Secondary | ICD-10-CM | POA: Diagnosis not present

## 2013-11-22 DIAGNOSIS — Z8249 Family history of ischemic heart disease and other diseases of the circulatory system: Secondary | ICD-10-CM | POA: Diagnosis not present

## 2013-11-22 DIAGNOSIS — E669 Obesity, unspecified: Secondary | ICD-10-CM | POA: Diagnosis not present

## 2013-11-22 NOTE — Progress Notes (Signed)
Rt. LE Venous duplex performed. 

## 2014-10-01 ENCOUNTER — Other Ambulatory Visit: Payer: Self-pay

## 2014-10-01 DIAGNOSIS — Z1231 Encounter for screening mammogram for malignant neoplasm of breast: Secondary | ICD-10-CM

## 2014-11-10 ENCOUNTER — Ambulatory Visit
Admission: RE | Admit: 2014-11-10 | Discharge: 2014-11-10 | Disposition: A | Discharge status: Home / Self Care | Payer: BC Managed Care – PPO | Source: Ambulatory Visit

## 2014-11-10 DIAGNOSIS — Z1231 Encounter for screening mammogram for malignant neoplasm of breast: Secondary | ICD-10-CM

## 2015-07-17 IMAGING — CR DG CHEST 2V
2 series · 2 of 2 positions shown · non-contrast
Comparison: PA and lateral chest 08/27/2012.

CLINICAL DATA: Preoperative films.

EXAM:
CHEST  2 VIEW

[w chest pa]
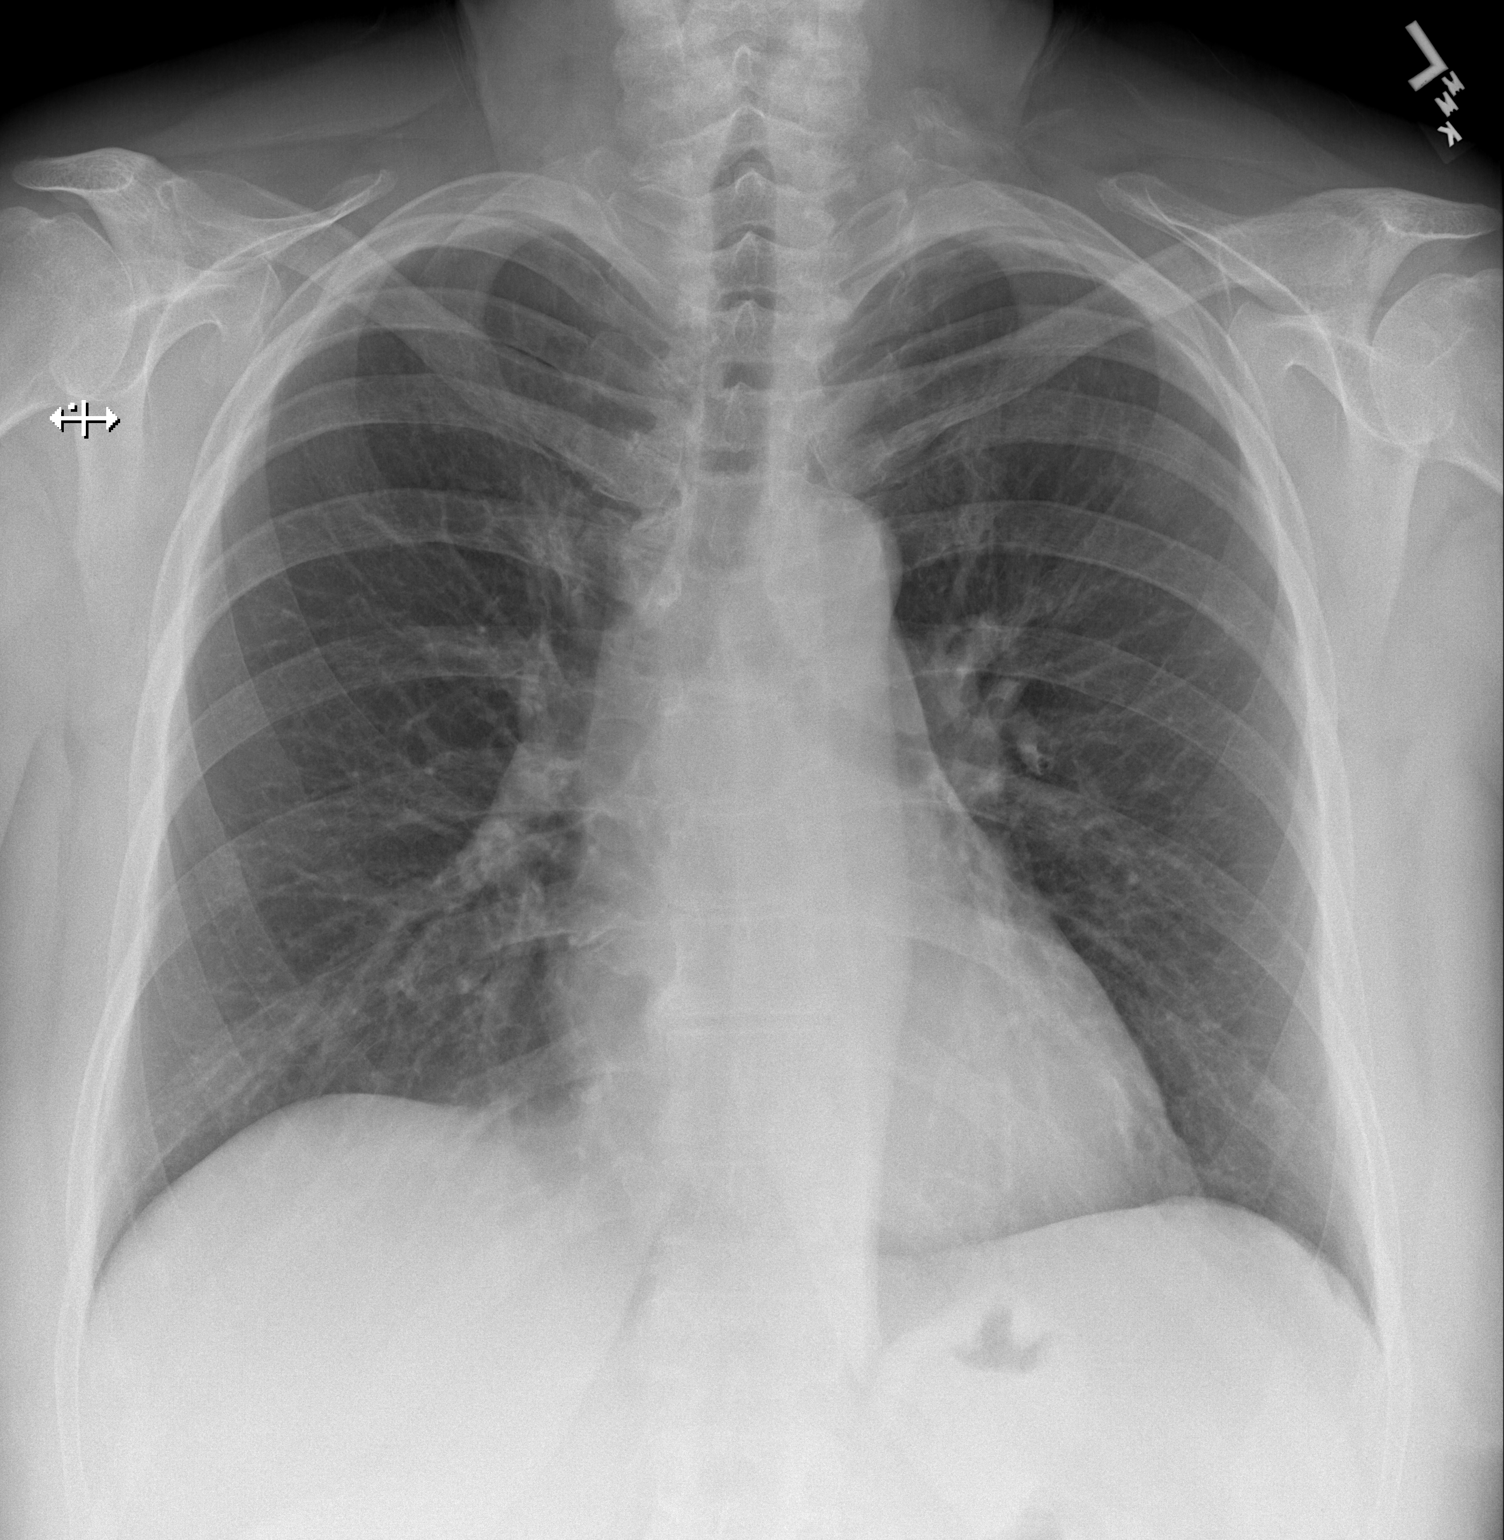

[w chest lat]
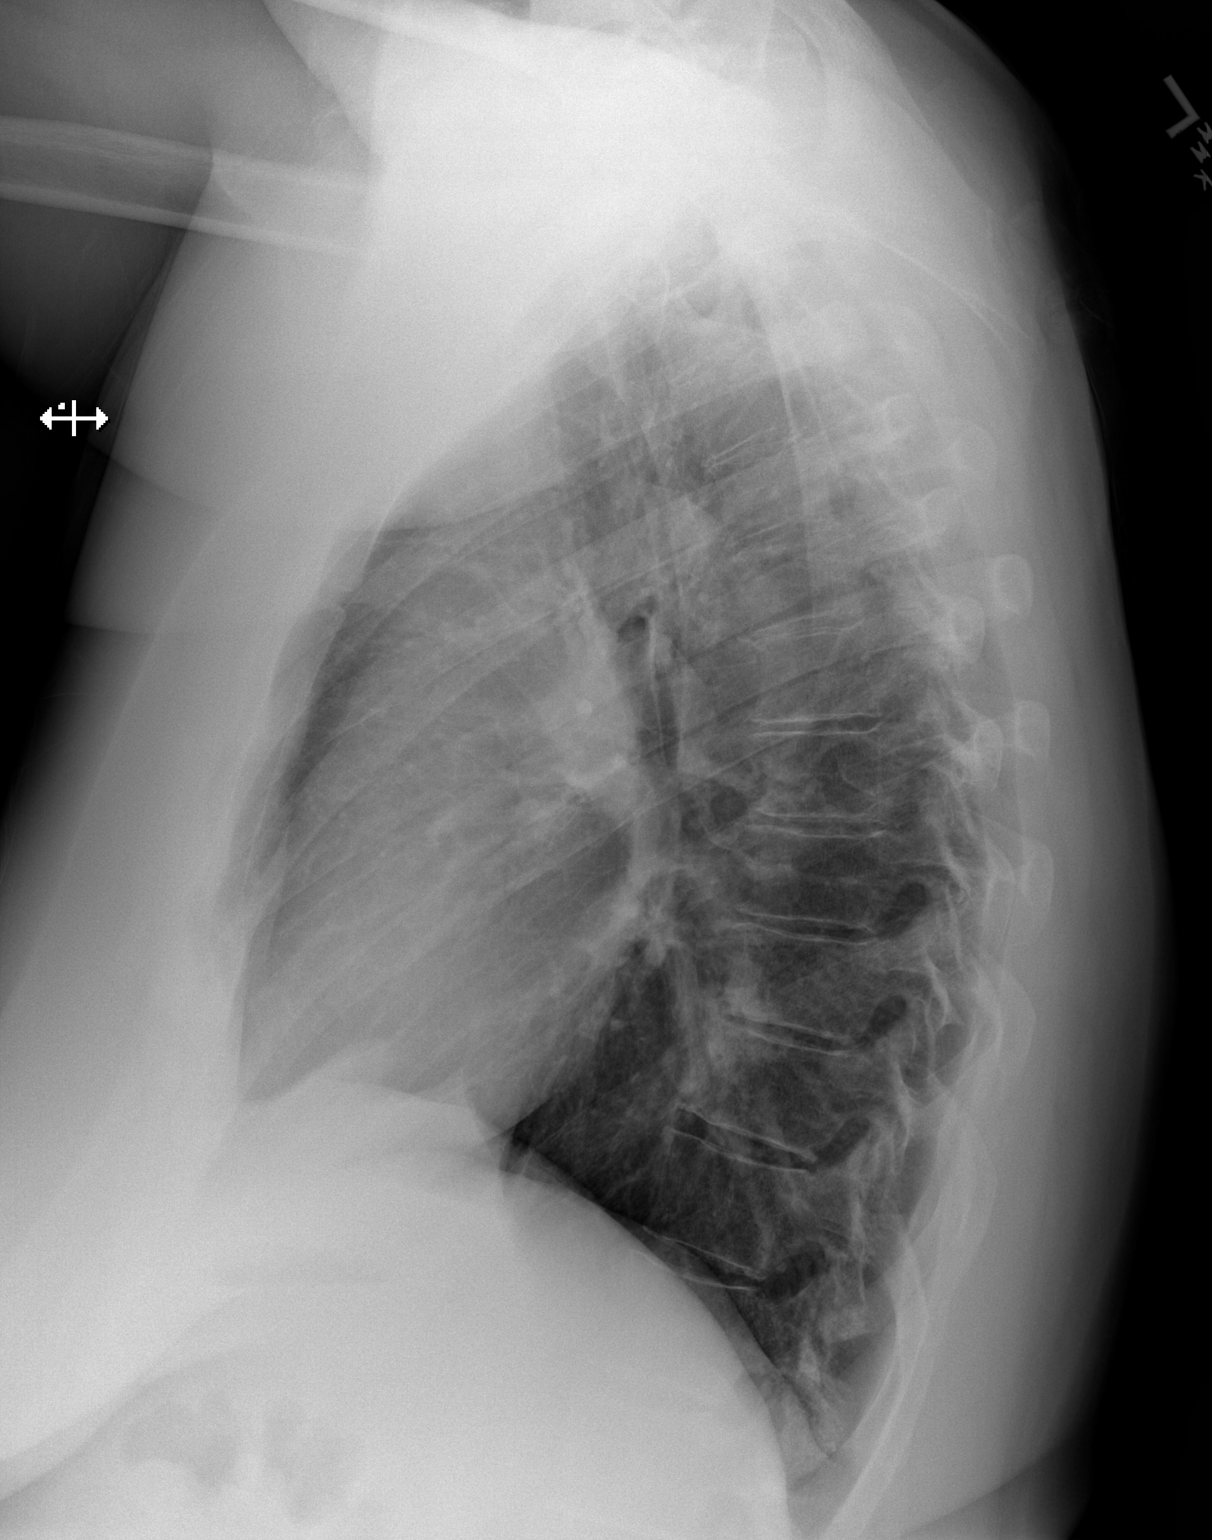

[2 of 2 positions shown; findings below may reference images not displayed]

FINDINGS: Lungs clear. Heart size normal. No pneumothorax or pleural effusion.
Mild thoracic spondylosis is noted.
IMPRESSION: No acute disease.

## 2015-07-25 IMAGING — CR DG KNEE 1-2V PORT*R*
2 series · 2 of 2 positions shown · non-contrast
Comparison: None.

CLINICAL DATA: Postoperative from right knee arthroplasty

EXAM:
PORTABLE RIGHT KNEE - 1-2 VIEW

[AP]
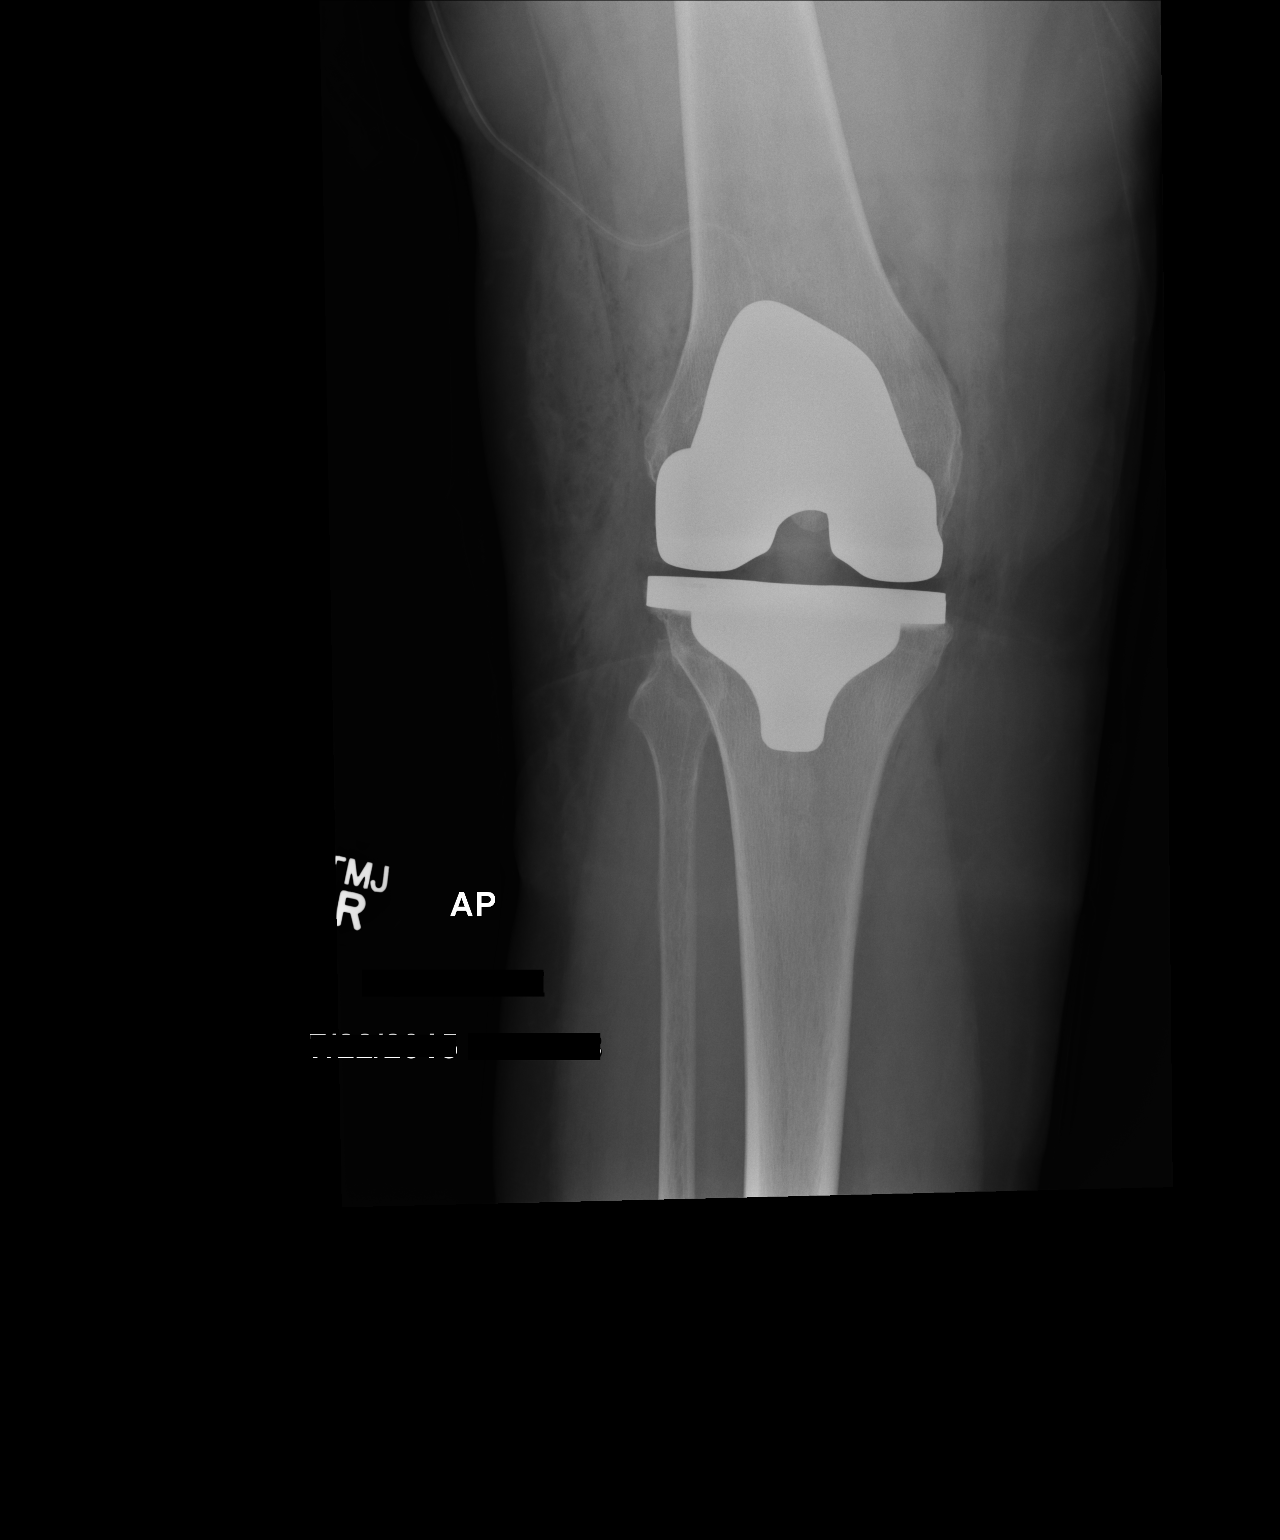

[xtable lateral]
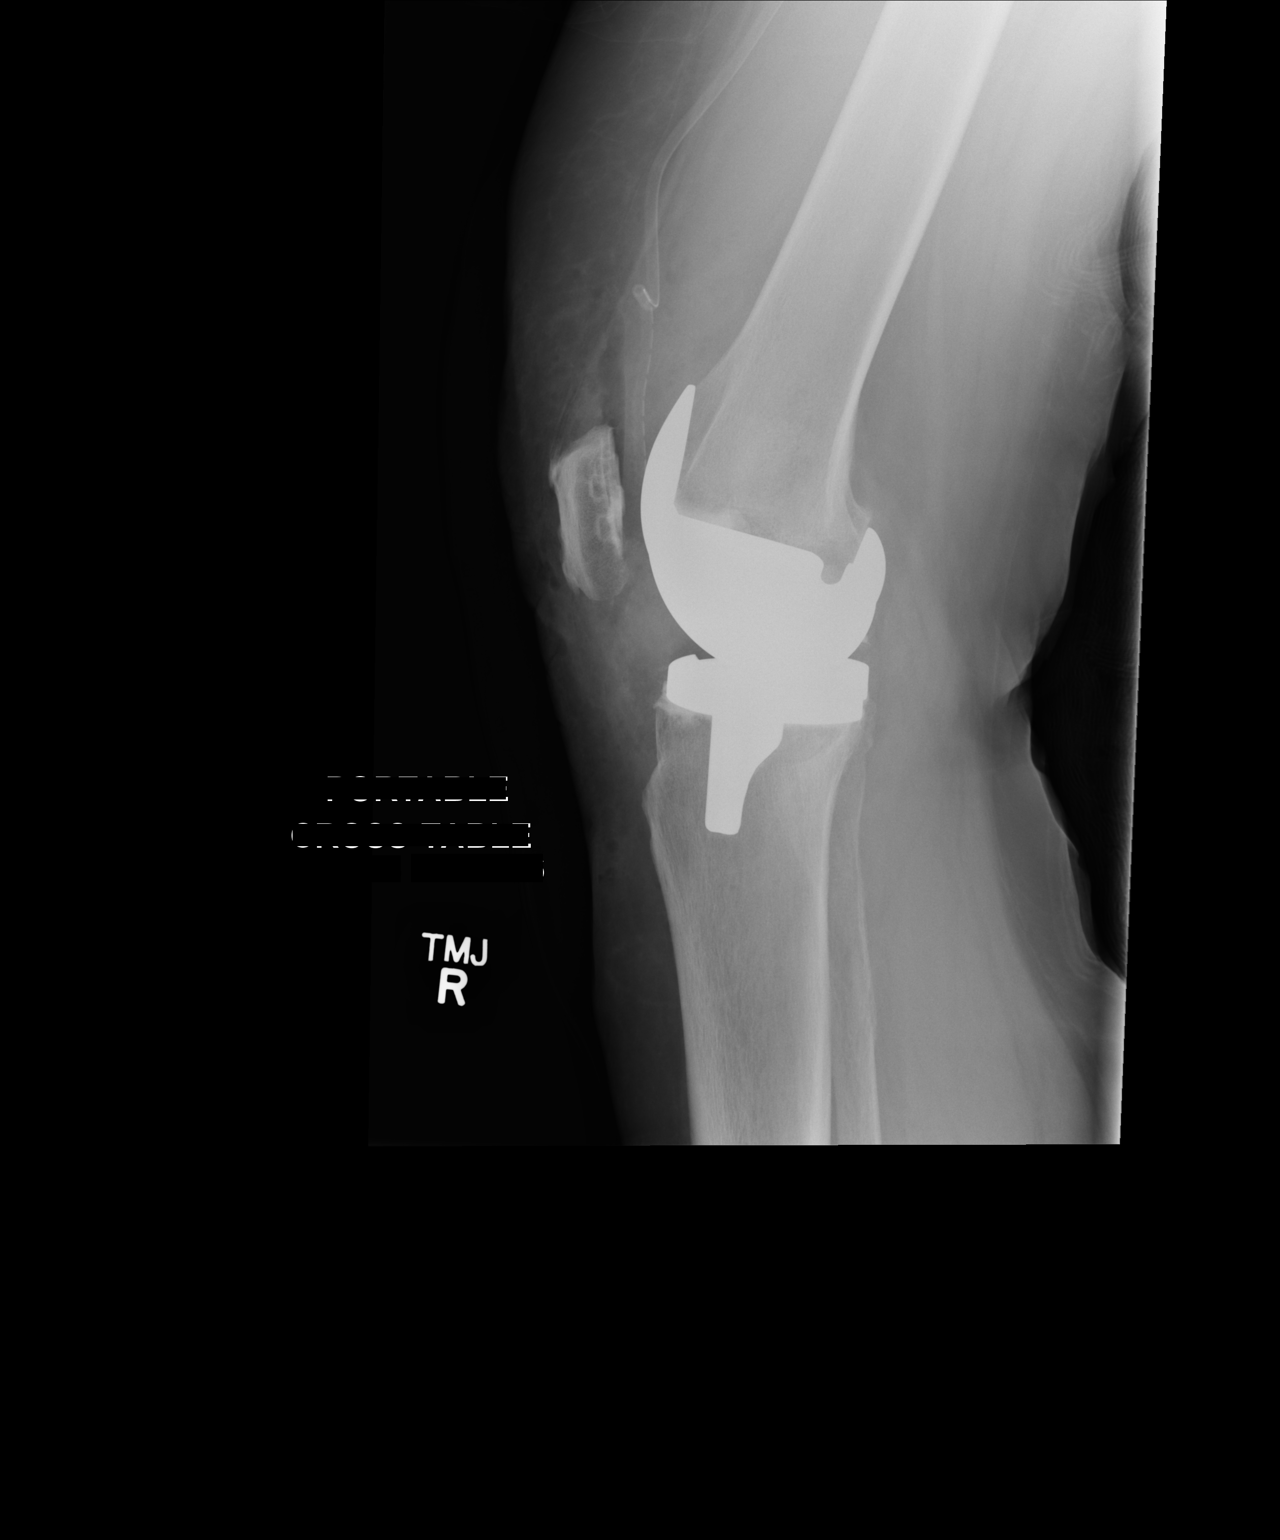

[2 of 2 positions shown; findings below may reference images not displayed]

FINDINGS: The patient has undergone right total knee joint prosthesis
placement. Radiographic positioning of the prosthetic components is
good. The interface with the native bone is normal. A surgical drain
line is present.
IMPRESSION: The there is no immediate postprocedure complication following right
total knee joint replacement.

## 2015-10-06 ENCOUNTER — Other Ambulatory Visit: Payer: Self-pay | Admitting: Physician Assistant

## 2015-10-06 DIAGNOSIS — Z1231 Encounter for screening mammogram for malignant neoplasm of breast: Secondary | ICD-10-CM

## 2015-11-11 ENCOUNTER — Ambulatory Visit: Payer: Self-pay

## 2015-11-13 ENCOUNTER — Ambulatory Visit
Admission: RE | Admit: 2015-11-13 | Discharge: 2015-11-13 | Disposition: A | Discharge status: Home / Self Care | Payer: BC Managed Care – PPO | Source: Ambulatory Visit | Attending: Physician Assistant | Admitting: Physician Assistant

## 2015-11-13 DIAGNOSIS — Z1231 Encounter for screening mammogram for malignant neoplasm of breast: Secondary | ICD-10-CM

## 2016-09-30 ENCOUNTER — Other Ambulatory Visit (HOSPITAL_COMMUNITY): Payer: Self-pay

## 2016-10-03 ENCOUNTER — Other Ambulatory Visit (HOSPITAL_COMMUNITY): Payer: Self-pay

## 2016-10-10 ENCOUNTER — Other Ambulatory Visit: Payer: Self-pay | Admitting: Physician Assistant

## 2016-10-10 DIAGNOSIS — Z1231 Encounter for screening mammogram for malignant neoplasm of breast: Secondary | ICD-10-CM

## 2016-11-21 DIAGNOSIS — M1712 Unilateral primary osteoarthritis, left knee: Secondary | ICD-10-CM | POA: Diagnosis present

## 2016-11-21 NOTE — H&P (Signed)
This is a pleasant 61 year-old female who presents to our clinic today with left knee pain.  History of osteoarthritis.  She is now having global pain to the left knee.  Worse with any activity.  This is starting to wake her up at night and affect her ADLs.  She has tried all conservative treatment options to include oral anti-inflammatories and intraarticular Cortisone injections.  She would like to proceed with definitive treatment of a left total knee replacement.   Past medical, social and family history reviewed in detail on the patient questionnaire and signed.  Review of systems: As detailed in HPI.  All others reviewed and are negative.         EXAMINATION: Well-developed, well-nourished female in no acute distress.  Alert and oriented x 3.  Examination of her left knee reveals a trace effusion.  Range of motion 0-115 degrees.  Medial and lateral joint line tenderness.  Moderate patellofemoral crepitus.  She is neurovascularly intact distally.    X-RAYS: X-rays reveal bone on bone in all three compartments.    IMPRESSION: Primary localized osteoarthritis, left knee.  PLAN: At this point there is nothing short of total knee replacement that will relieve Lis's symptoms.  We are going to go ahead and fill out paperwork.  Risks, benefits and possible complications of surgery have been reviewed.  Rehab and recovery time discussed.  All questions answered.  Paperwork complete.

## 2016-11-22 ENCOUNTER — Other Ambulatory Visit (HOSPITAL_COMMUNITY): Payer: Self-pay

## 2016-11-25 NOTE — Pre-Procedure Instructions (Signed)
Taylor Travis  11/25/2016      [id number] - [place] ([place]), [place] - [address] [place] ([place]) [place] [address] Phone: [phone] Fax: [phone]  [id number] - [place], [place] - [address] [place] [address] Phone: [phone] Fax: [phone]    Your procedure is scheduled on September 26  Report to [facility] Admitting at 0530 A.M.  Call this number if you have problems the morning of surgery:  [phone]   Remember:  Do not eat food or drink liquids after midnight.  Continue all other medications as directed by your physician except follow these medication instructions before surgery   Take these medicines the morning of surgery with A SIP OF WATER  acetaminophen (TYLENOL) meclizine (ANTIVERT)   7 days prior to surgery STOP taking any Aleve, Naproxen, Ibuprofen, Motrin, Advil, Goody's, BC's, all herbal medications, fish oil, and all vitamins  Follow your doctors instructions regarding your Aspirin.  If no instructions were given by the doctor you will need to call the office to get instructions.  Your pre admission RN will also call for those instructions    Do not wear jewelry, make-up or nail polish.  Do not wear lotions, powders, or perfumes, or deoderant.  Do not shave 48 hours prior to surgery.  Men may shave face and neck.  Do not bring valuables to the hospital.  [facility] is not responsible for any belongings or valuables.  Contacts, dentures or bridgework may not be worn into surgery.  Leave your suitcase in the car.  After surgery it may be brought to your room.  For patients admitted to the hospital, discharge time will be determined by your treatment team.  Patients discharged the day of surgery will not be allowed to drive home.    Special instructions:   [place]- Preparing For Surgery  Before surgery, you can play an important role. Because  skin is not sterile, your skin needs to be as free of germs as possible. You can reduce the number of germs on your skin by washing with CHG (chlorahexidine gluconate) Soap before surgery.  CHG is an antiseptic cleaner which kills germs and bonds with the skin to continue killing germs even after washing.  Please do not use if you have an allergy to CHG or antibacterial soaps. If your skin becomes reddened/irritated stop using the CHG.  Do not shave (including legs and underarms) for at least 48 hours prior to first CHG shower. It is OK to shave your face.  Please follow these instructions carefully.   1. Shower the NIGHT BEFORE SURGERY and the MORNING OF SURGERY with CHG.   2. If you chose to wash your hair, wash your hair first as usual with your normal shampoo.  3. After you shampoo, rinse your hair and body thoroughly to remove the shampoo.  4. Use CHG as you would any other liquid soap. You can apply CHG directly to the skin and wash gently with a scrungie or a clean washcloth.   5. Apply the CHG Soap to your body ONLY FROM THE NECK DOWN.  Do not use on open wounds or open sores. Avoid contact with your eyes, ears, mouth and genitals (private parts). Wash genitals (private parts) with your normal soap.  6. Wash thoroughly, paying special attention to the area where your surgery will be performed.  7. Thoroughly rinse your body with warm water  from the neck down.  8. DO NOT shower/wash with your normal soap after using and rinsing off the CHG Soap.  9. Pat yourself dry with a CLEAN TOWEL.   10. Wear CLEAN PAJAMAS   11. Place CLEAN SHEETS on your bed the night of your first shower and DO NOT SLEEP WITH PETS.    Day of Surgery: Do not apply any deodorants/lotions. Please wear clean clothes to the hospital/surgery center.      Please read over the following fact sheets that you were given.

## 2016-11-28 ENCOUNTER — Encounter (HOSPITAL_COMMUNITY): Payer: Self-pay

## 2016-11-28 ENCOUNTER — Encounter (HOSPITAL_COMMUNITY)
Admission: RE | Admit: 2016-11-28 | Discharge: 2016-11-28 | Disposition: A | Discharge status: Home / Self Care | Payer: BC Managed Care – PPO | Source: Ambulatory Visit | Attending: Orthopedic Surgery | Admitting: Orthopedic Surgery

## 2016-11-28 DIAGNOSIS — Z01812 Encounter for preprocedural laboratory examination: Secondary | ICD-10-CM | POA: Insufficient documentation

## 2016-11-28 DIAGNOSIS — M1712 Unilateral primary osteoarthritis, left knee: Secondary | ICD-10-CM | POA: Insufficient documentation

## 2016-11-28 DIAGNOSIS — Z01810 Encounter for preprocedural cardiovascular examination: Secondary | ICD-10-CM | POA: Diagnosis present

## 2016-11-28 HISTORY — DX: Headache: R51

## 2016-11-28 HISTORY — DX: Anemia, unspecified: D64.9

## 2016-11-28 HISTORY — DX: Headache, unspecified: R51.9

## 2016-11-28 LAB — CBC
HEMATOCRIT: 41.8 % (ref 36.0–46.0)
Hemoglobin: 13.7 g/dL (ref 12.0–15.0)
MCH: 28.2 pg (ref 26.0–34.0)
MCHC: 32.8 g/dL (ref 30.0–36.0)
MCV: 86 fL (ref 78.0–100.0)
Platelets: 201 10*3/uL (ref 150–400)
RBC: 4.86 MIL/uL (ref 3.87–5.11)
RDW: 13.4 % (ref 11.5–15.5)
WBC: 6.4 10*3/uL (ref 4.0–10.5)

## 2016-11-28 LAB — BASIC METABOLIC PANEL
ANION GAP: 7 (ref 5–15)
BUN: 11 mg/dL (ref 6–20)
CALCIUM: 9.3 mg/dL (ref 8.9–10.3)
CO2: 27 mmol/L (ref 22–32)
Chloride: 103 mmol/L (ref 101–111)
Creatinine, Ser: 0.88 mg/dL (ref 0.44–1.00)
GFR calc Af Amer: >60 mL/min (ref >60)
GFR calc non Af Amer: >60 mL/min (ref >60)
Glucose, Bld: 109 mg/dL — ABNORMAL HIGH (ref 65–99)
POTASSIUM: 4.4 mmol/L (ref 3.5–5.1)
Sodium: 137 mmol/L (ref 135–145)

## 2016-11-28 LAB — TYPE AND SCREEN
ABO/RH(D): AB NEG
Antibody Screen: NEGATIVE

## 2016-11-28 LAB — SURGICAL PCR SCREEN
MRSA, PCR: NEGATIVE
STAPHYLOCOCCUS AUREUS: NEGATIVE

## 2016-11-28 NOTE — Progress Notes (Signed)
Anesthesia PAT Evaluation: Patient is a 61 year old female scheduled for left TKA on 12/07/16 by Dr. Mckinley Jewel. Anesthesia is posted for spinal.   History includes former smoker (quit '06), post-operative N/V, murmur (age 71), vertigo, back surgery (~ age 29 for "disc rupture"), tonsillectomy, hysterectomy, right TKA 10/02/13 (done under general anesthesia), right wrist fracture repair (with hardware placement). BMI is consistent with obesity.   PCP is Noelle Redmon, [facility].  Meds include meclizine.  BP 128/68   Pulse 79   Temp 36.7 C   Resp 20   Ht 6' (1.829 m)   Wt 292 lb 12.8 oz (132.8 kg)   SpO2 97%   BMI 39.71 kg/m  Patient is a pleasant Caucasian female in NAD. She is tall. She denied chest pain, SOB, syncope. She will occasionally get palpitations when under "stress" that are self limited. Breathing exercises can help. Longest episodes of palpitations was ~ 15-30 minutes without associates chest pain, syncope. Last episode was two weeks ago while trying to catch a boat. She is not terribly bothered by her palpitations. During childhood she was prescribed Inderal by her pediatrician for "leaky valve" that was heard after a 12 inch growth spurt. He had told her the murmur may resolve (could have been related to significant growth spurt). She does not recall if she had an echo then, but denied any recent cardiac testing. She has not been on Inderal for years. Other than post-operative N/V, she denied any known anesthesia complications. She has had spinal anesthesia in the past without problem. On exam today, I did not hear any significant murmur (possible grade I). Rhythm and rate regular. Lungs clear. No carotid bruits noted. No ankle edema.   EKG 11/28/16: NSR.  Preoperative labs noted. Glucose 109. Cr 0.88. CBC WNL. T&S done.   If no acute changes then I anticipate that she can proceed as planned.  Velna Ochs [facility] Stay Center/Anesthesiology Phone [phone] 11/28/2016 1:19 PM

## 2016-11-28 NOTE — Progress Notes (Signed)
PCP is [employer], [place]  LOV 11/2015 She states at age 21yr , started in PE class at school..palpitations.  Was given inderal then.  No longer has the issue, nor takes inderal.  Hasn't seen a cardio, and back then she doesn't even think she saw a cardio then, just her regular pcp.  No tests.

## 2016-11-30 ENCOUNTER — Ambulatory Visit
Admission: RE | Admit: 2016-11-30 | Discharge: 2016-11-30 | Disposition: A | Discharge status: Home / Self Care | Payer: BC Managed Care – PPO | Source: Ambulatory Visit | Attending: Physician Assistant | Admitting: Physician Assistant

## 2016-11-30 DIAGNOSIS — Z1231 Encounter for screening mammogram for malignant neoplasm of breast: Secondary | ICD-10-CM

## 2016-12-06 MED ORDER — DEXTROSE 5 % IV SOLN
3.0000 g | INTRAVENOUS | Status: AC
  Administered 2016-12-07: 3 g via INTRAVENOUS
  Filled 2016-12-06: qty 3000

## 2016-12-06 MED ORDER — TRANEXAMIC ACID 1000 MG/10ML IV SOLN
1000.0000 mg | INTRAVENOUS | Status: AC
  Administered 2016-12-07: 1000 mg via INTRAVENOUS
  Filled 2016-12-06: qty 10

## 2016-12-07 ENCOUNTER — Inpatient Hospital Stay (HOSPITAL_COMMUNITY): Payer: BC Managed Care – PPO

## 2016-12-07 ENCOUNTER — Inpatient Hospital Stay (HOSPITAL_COMMUNITY): Payer: BC Managed Care – PPO | Admitting: Anesthesiology

## 2016-12-07 ENCOUNTER — Inpatient Hospital Stay (HOSPITAL_COMMUNITY)
Admission: RE | Admit: 2016-12-07 | Discharge: 2016-12-09 | DRG: 470 | Disposition: A | Discharge status: Home Health | Payer: BC Managed Care – PPO | Source: Ambulatory Visit | Attending: Orthopedic Surgery | Admitting: Orthopedic Surgery

## 2016-12-07 ENCOUNTER — Inpatient Hospital Stay (HOSPITAL_COMMUNITY): Payer: BC Managed Care – PPO | Admitting: Vascular Surgery

## 2016-12-07 ENCOUNTER — Encounter (HOSPITAL_COMMUNITY): Payer: Self-pay | Admitting: *Deleted

## 2016-12-07 ENCOUNTER — Encounter (HOSPITAL_COMMUNITY)
Admission: RE | Disposition: A | Discharge status: Home Health | Payer: Self-pay | Source: Ambulatory Visit | Attending: Orthopedic Surgery

## 2016-12-07 DIAGNOSIS — M25562 Pain in left knee: Secondary | ICD-10-CM | POA: Diagnosis present

## 2016-12-07 DIAGNOSIS — M1712 Unilateral primary osteoarthritis, left knee: Secondary | ICD-10-CM | POA: Diagnosis present

## 2016-12-07 DIAGNOSIS — Z96659 Presence of unspecified artificial knee joint: Secondary | ICD-10-CM

## 2016-12-07 DIAGNOSIS — D62 Acute posthemorrhagic anemia: Secondary | ICD-10-CM | POA: Diagnosis not present

## 2016-12-07 HISTORY — PX: TOTAL KNEE ARTHROPLASTY: SHX125

## 2016-12-07 SURGERY — ARTHROPLASTY, KNEE, TOTAL
Anesthesia: General | Site: Knee | Laterality: Left

## 2016-12-07 MED ORDER — ONDANSETRON HCL 4 MG PO TABS: 4.0000 mg | ORAL_TABLET | Freq: Four times a day (QID) | ORAL | Status: DC | PRN

## 2016-12-07 MED ORDER — LIDOCAINE 2% (20 MG/ML) 5 ML SYRINGE
INTRAMUSCULAR | Status: DC | PRN
  Administered 2016-12-07: 40 mg via INTRAVENOUS

## 2016-12-07 MED ORDER — ROCURONIUM BROMIDE 10 MG/ML (PF) SYRINGE
PREFILLED_SYRINGE | INTRAVENOUS | Status: AC
  Filled 2016-12-07: qty 5

## 2016-12-07 MED ORDER — MIDAZOLAM HCL 2 MG/2ML IJ SOLN
INTRAMUSCULAR | Status: AC
  Administered 2016-12-07: 1 mg via INTRAVENOUS
  Filled 2016-12-07: qty 2

## 2016-12-07 MED ORDER — BUPIVACAINE HCL 0.5 % IJ SOLN: INTRAMUSCULAR | Status: DC | PRN

## 2016-12-07 MED ORDER — CHLORHEXIDINE GLUCONATE 4 % EX LIQD: 60.0000 mL | Freq: Once | CUTANEOUS | Status: DC

## 2016-12-07 MED ORDER — MIDAZOLAM HCL 2 MG/2ML IJ SOLN
1.0000 mg | Freq: Once | INTRAMUSCULAR | Status: DC
Start: 2016-12-07 — End: 2016-12-07
  Administered 2016-12-07: 1 mg via INTRAVENOUS

## 2016-12-07 MED ORDER — MENTHOL 3 MG MT LOZG: 1.0000 | LOZENGE | OROMUCOSAL | Status: DC | PRN

## 2016-12-07 MED ORDER — ONDANSETRON HCL 4 MG/2ML IJ SOLN: 4.0000 mg | Freq: Once | INTRAMUSCULAR | Status: DC | PRN

## 2016-12-07 MED ORDER — DEXAMETHASONE SODIUM PHOSPHATE 10 MG/ML IJ SOLN
10.0000 mg | Freq: Once | INTRAMUSCULAR | Status: AC
  Administered 2016-12-08: 10 mg via INTRAVENOUS
  Filled 2016-12-07: qty 1

## 2016-12-07 MED ORDER — METOCLOPRAMIDE HCL 5 MG/ML IJ SOLN: 5.0000 mg | Freq: Three times a day (TID) | INTRAMUSCULAR | Status: DC | PRN

## 2016-12-07 MED ORDER — SODIUM CHLORIDE 0.9 % IJ SOLN
INTRAMUSCULAR | Status: DC | PRN
  Administered 2016-12-07: 40 mL

## 2016-12-07 MED ORDER — PHENOL 1.4 % MT LIQD: 1.0000 | OROMUCOSAL | Status: DC | PRN

## 2016-12-07 MED ORDER — CEFAZOLIN SODIUM-DEXTROSE 2-4 GM/100ML-% IV SOLN
2.0000 g | Freq: Four times a day (QID) | INTRAVENOUS | Status: AC
  Administered 2016-12-07 (×2): 2 g via INTRAVENOUS
  Filled 2016-12-07 (×2): qty 100

## 2016-12-07 MED ORDER — HYDROMORPHONE HCL 1 MG/ML IJ SOLN: 0.5000 mg | INTRAMUSCULAR | Status: DC | PRN

## 2016-12-07 MED ORDER — PROPOFOL 10 MG/ML IV BOLUS
INTRAVENOUS | Status: AC
  Filled 2016-12-07: qty 20

## 2016-12-07 MED ORDER — DEXAMETHASONE SODIUM PHOSPHATE 10 MG/ML IJ SOLN
INTRAMUSCULAR | Status: AC
  Filled 2016-12-07: qty 1

## 2016-12-07 MED ORDER — FENTANYL CITRATE (PF) 100 MCG/2ML IJ SOLN
INTRAMUSCULAR | Status: DC | PRN
  Administered 2016-12-07 (×2): 50 ug via INTRAVENOUS
  Administered 2016-12-07: 100 ug via INTRAVENOUS
  Administered 2016-12-07: 50 ug via INTRAVENOUS

## 2016-12-07 MED ORDER — FENTANYL CITRATE (PF) 100 MCG/2ML IJ SOLN
25.0000 ug | INTRAMUSCULAR | Status: DC | PRN
  Administered 2016-12-07 (×2): 50 ug via INTRAVENOUS

## 2016-12-07 MED ORDER — MAGNESIUM CITRATE PO SOLN: 1.0000 | Freq: Once | ORAL | Status: DC | PRN

## 2016-12-07 MED ORDER — ONDANSETRON HCL 4 MG/2ML IJ SOLN
INTRAMUSCULAR | Status: DC | PRN
  Administered 2016-12-07: 4 mg via INTRAVENOUS

## 2016-12-07 MED ORDER — ACETAMINOPHEN 650 MG RE SUPP: 650.0000 mg | Freq: Four times a day (QID) | RECTAL | Status: DC | PRN

## 2016-12-07 MED ORDER — PROPOFOL 10 MG/ML IV BOLUS
INTRAVENOUS | Status: DC | PRN
  Administered 2016-12-07: 150 mg via INTRAVENOUS

## 2016-12-07 MED ORDER — ASPIRIN EC 325 MG PO TBEC: 325.0000 mg | DELAYED_RELEASE_TABLET | Freq: Every day | ORAL | 0 refills | Status: AC

## 2016-12-07 MED ORDER — ACETAMINOPHEN 325 MG PO TABS: 650.0000 mg | ORAL_TABLET | Freq: Four times a day (QID) | ORAL | Status: DC | PRN

## 2016-12-07 MED ORDER — FENTANYL CITRATE (PF) 250 MCG/5ML IJ SOLN
INTRAMUSCULAR | Status: AC
  Filled 2016-12-07: qty 5

## 2016-12-07 MED ORDER — CELECOXIB 200 MG PO CAPS
200.0000 mg | ORAL_CAPSULE | Freq: Two times a day (BID) | ORAL | Status: DC
  Administered 2016-12-07 – 2016-12-09 (×5): 200 mg via ORAL
  Filled 2016-12-07 (×5): qty 1

## 2016-12-07 MED ORDER — HYDROCODONE-ACETAMINOPHEN 7.5-325 MG PO TABS
1.0000 | ORAL_TABLET | ORAL | Status: DC | PRN
  Administered 2016-12-07 – 2016-12-09 (×10): 1 via ORAL
  Filled 2016-12-07 (×10): qty 1

## 2016-12-07 MED ORDER — BUPIVACAINE LIPOSOME 1.3 % IJ SUSP
20.0000 mL | Freq: Once | INTRAMUSCULAR | Status: DC
  Administered 2016-12-07: 266 mg
  Filled 2016-12-07: qty 20

## 2016-12-07 MED ORDER — LACTATED RINGERS IV SOLN: INTRAVENOUS | Status: DC

## 2016-12-07 MED ORDER — METHOCARBAMOL 500 MG PO TABS: 500.0000 mg | ORAL_TABLET | Freq: Four times a day (QID) | ORAL | 0 refills | Status: AC

## 2016-12-07 MED ORDER — FENTANYL CITRATE (PF) 100 MCG/2ML IJ SOLN
INTRAMUSCULAR | Status: AC
  Administered 2016-12-07: 10:00:00
  Filled 2016-12-07: qty 2

## 2016-12-07 MED ORDER — ONDANSETRON HCL 4 MG/2ML IJ SOLN
INTRAMUSCULAR | Status: AC
  Filled 2016-12-07: qty 2

## 2016-12-07 MED ORDER — DOCUSATE SODIUM 100 MG PO CAPS
100.0000 mg | ORAL_CAPSULE | Freq: Two times a day (BID) | ORAL | Status: DC
  Administered 2016-12-07 – 2016-12-08 (×3): 100 mg via ORAL
  Filled 2016-12-07 (×4): qty 1

## 2016-12-07 MED ORDER — POLYETHYLENE GLYCOL 3350 17 G PO PACK
17.0000 g | PACK | Freq: Every day | ORAL | Status: DC | PRN
  Administered 2016-12-08: 17 g via ORAL
  Filled 2016-12-07: qty 1

## 2016-12-07 MED ORDER — DEXAMETHASONE SODIUM PHOSPHATE 10 MG/ML IJ SOLN
INTRAMUSCULAR | Status: DC | PRN
  Administered 2016-12-07: 10 mg via INTRAVENOUS

## 2016-12-07 MED ORDER — MECLIZINE HCL 25 MG PO TABS
50.0000 mg | ORAL_TABLET | Freq: Three times a day (TID) | ORAL | Status: DC | PRN
  Filled 2016-12-07: qty 2

## 2016-12-07 MED ORDER — ASPIRIN EC 325 MG PO TBEC
325.0000 mg | DELAYED_RELEASE_TABLET | Freq: Every day | ORAL | Status: DC
  Administered 2016-12-08 – 2016-12-09 (×2): 325 mg via ORAL
  Filled 2016-12-07 (×2): qty 1

## 2016-12-07 MED ORDER — BISACODYL 5 MG PO TBEC: 5.0000 mg | DELAYED_RELEASE_TABLET | Freq: Every day | ORAL | Status: DC | PRN

## 2016-12-07 MED ORDER — BUPIVACAINE LIPOSOME 1.3 % IJ SUSP
INTRAMUSCULAR | Status: DC | PRN
  Administered 2016-12-07: 20 mL

## 2016-12-07 MED ORDER — ALUM & MAG HYDROXIDE-SIMETH 200-200-20 MG/5ML PO SUSP: 30.0000 mL | ORAL | Status: DC | PRN

## 2016-12-07 MED ORDER — LIDOCAINE 2% (20 MG/ML) 5 ML SYRINGE
INTRAMUSCULAR | Status: AC
  Filled 2016-12-07: qty 5

## 2016-12-07 MED ORDER — SUGAMMADEX SODIUM 500 MG/5ML IV SOLN
INTRAVENOUS | Status: AC
  Filled 2016-12-07: qty 5

## 2016-12-07 MED ORDER — FENTANYL CITRATE (PF) 100 MCG/2ML IJ SOLN
INTRAMUSCULAR | Status: AC
  Administered 2016-12-07: 50 ug via INTRAVENOUS
  Filled 2016-12-07: qty 2

## 2016-12-07 MED ORDER — ROCURONIUM BROMIDE 10 MG/ML (PF) SYRINGE
PREFILLED_SYRINGE | INTRAVENOUS | Status: DC | PRN
  Administered 2016-12-07: 50 mg via INTRAVENOUS
  Administered 2016-12-07: 20 mg via INTRAVENOUS

## 2016-12-07 MED ORDER — SUGAMMADEX SODIUM 200 MG/2ML IV SOLN
INTRAVENOUS | Status: DC | PRN
  Administered 2016-12-07: 300 mg via INTRAVENOUS

## 2016-12-07 MED ORDER — ONDANSETRON HCL 4 MG/2ML IJ SOLN: 4.0000 mg | Freq: Four times a day (QID) | INTRAMUSCULAR | Status: DC | PRN

## 2016-12-07 MED ORDER — METOCLOPRAMIDE HCL 5 MG PO TABS: 5.0000 mg | ORAL_TABLET | Freq: Three times a day (TID) | ORAL | Status: DC | PRN

## 2016-12-07 MED ORDER — POTASSIUM CHLORIDE IN NACL 20-0.9 MEQ/L-% IV SOLN
INTRAVENOUS | Status: DC
  Administered 2016-12-07 – 2016-12-08 (×2): via INTRAVENOUS
  Filled 2016-12-07 (×2): qty 1000

## 2016-12-07 MED ORDER — SODIUM CHLORIDE 0.9 % IR SOLN
Status: DC | PRN
  Administered 2016-12-07: 3000 mL

## 2016-12-07 MED ORDER — ONDANSETRON HCL 4 MG PO TABS: 4.0000 mg | ORAL_TABLET | Freq: Three times a day (TID) | ORAL | 0 refills | Status: AC | PRN

## 2016-12-07 MED ORDER — FENTANYL CITRATE (PF) 100 MCG/2ML IJ SOLN
50.0000 ug | Freq: Once | INTRAMUSCULAR | Status: DC
  Administered 2016-12-07: 50 ug via INTRAVENOUS

## 2016-12-07 MED ORDER — HYDROCODONE-ACETAMINOPHEN 7.5-325 MG PO TABS: 1.0000 | ORAL_TABLET | ORAL | 0 refills | Status: AC | PRN

## 2016-12-07 MED ORDER — LACTATED RINGERS IV SOLN
INTRAVENOUS | Status: DC | PRN
  Administered 2016-12-07 (×2): via INTRAVENOUS

## 2016-12-07 MED ORDER — DIPHENHYDRAMINE HCL 12.5 MG/5ML PO ELIX: 12.5000 mg | ORAL_SOLUTION | ORAL | Status: DC | PRN

## 2016-12-07 MED ORDER — METHOCARBAMOL 1000 MG/10ML IJ SOLN
500.0000 mg | Freq: Four times a day (QID) | INTRAVENOUS | Status: DC | PRN
  Filled 2016-12-07: qty 5

## 2016-12-07 MED ORDER — BUPIVACAINE-EPINEPHRINE (PF) 0.5% -1:200000 IJ SOLN
INTRAMUSCULAR | Status: AC
  Filled 2016-12-07: qty 30

## 2016-12-07 MED ORDER — METHOCARBAMOL 500 MG PO TABS
500.0000 mg | ORAL_TABLET | Freq: Four times a day (QID) | ORAL | Status: DC | PRN
  Administered 2016-12-07 – 2016-12-09 (×6): 500 mg via ORAL
  Filled 2016-12-07 (×7): qty 1

## 2016-12-07 MED ORDER — ZOLPIDEM TARTRATE 5 MG PO TABS: 5.0000 mg | ORAL_TABLET | Freq: Every evening | ORAL | Status: DC | PRN

## 2016-12-07 SURGICAL SUPPLY — 58 items
BANDAGE ACE 4X5 VEL STRL LF (GAUZE/BANDAGES/DRESSINGS) ×3 IMPLANT
BANDAGE ACE 6X5 VEL STRL LF (GAUZE/BANDAGES/DRESSINGS) ×3 IMPLANT
BANDAGE ELASTIC 4 VELCRO ST LF (GAUZE/BANDAGES/DRESSINGS) ×2 IMPLANT
BANDAGE ELASTIC 6 VELCRO ST LF (GAUZE/BANDAGES/DRESSINGS) ×2 IMPLANT
BANDAGE ESMARK 6X9 LF (GAUZE/BANDAGES/DRESSINGS) ×1 IMPLANT
BLADE SAG 18X100X1.27 (BLADE) ×6 IMPLANT
BNDG CMPR 9X6 STRL LF SNTH (GAUZE/BANDAGES/DRESSINGS) ×1
BNDG ESMARK 6X9 LF (GAUZE/BANDAGES/DRESSINGS) ×3
BOWL SMART MIX CTS (DISPOSABLE) IMPLANT
CAPT KNEE TRIATH TK-4 ×2 IMPLANT
CLOSURE WOUND 1/2 X4 (GAUZE/BANDAGES/DRESSINGS) ×2
COVER SURGICAL LIGHT HANDLE (MISCELLANEOUS) ×3 IMPLANT
CUFF TOURNIQUET SINGLE 34IN LL (TOURNIQUET CUFF) ×3 IMPLANT
DRAPE EXTREMITY T 121X128X90 (DRAPE) ×3 IMPLANT
DRAPE U-SHAPE 47X51 STRL (DRAPES) ×3 IMPLANT
DRSG AQUACEL AG ADV 3.5X10 (GAUZE/BANDAGES/DRESSINGS) ×3 IMPLANT
DURAPREP 26ML APPLICATOR (WOUND CARE) ×6 IMPLANT
ELECT CAUTERY BLADE 6.4 (BLADE) ×3 IMPLANT
ELECT REM PT RETURN 9FT ADLT (ELECTROSURGICAL) ×3
ELECTRODE REM PT RTRN 9FT ADLT (ELECTROSURGICAL) ×1 IMPLANT
FACESHIELD WRAPAROUND (MASK) ×6 IMPLANT
FACESHIELD WRAPAROUND OR TEAM (MASK) ×2 IMPLANT
GLOVE BIOGEL PI IND STRL 7.0 (GLOVE) ×1 IMPLANT
GLOVE BIOGEL PI INDICATOR 7.0 (GLOVE) ×2
GLOVE ECLIPSE 7.0 STRL STRAW (GLOVE) ×3 IMPLANT
GLOVE ORTHO TXT STRL SZ7.5 (GLOVE) ×3 IMPLANT
GOWN STRL REUS W/ TWL LRG LVL3 (GOWN DISPOSABLE) ×2 IMPLANT
GOWN STRL REUS W/ TWL XL LVL3 (GOWN DISPOSABLE) ×1 IMPLANT
GOWN STRL REUS W/TWL LRG LVL3 (GOWN DISPOSABLE) ×6
GOWN STRL REUS W/TWL XL LVL3 (GOWN DISPOSABLE) ×3
HANDPIECE INTERPULSE COAX TIP (DISPOSABLE) ×3
IMMOBILIZER KNEE 22 UNIV (SOFTGOODS) ×2 IMPLANT
KIT BASIN OR (CUSTOM PROCEDURE TRAY) ×3 IMPLANT
KIT ROOM TURNOVER OR (KITS) ×3 IMPLANT
MANIFOLD NEPTUNE II (INSTRUMENTS) ×3 IMPLANT
NDL 18GX1X1/2 (RX/OR ONLY) (NEEDLE) ×1 IMPLANT
NDL HYPO 25GX1X1/2 BEV (NEEDLE) IMPLANT
NEEDLE 18GX1X1/2 (RX/OR ONLY) (NEEDLE) ×3 IMPLANT
NEEDLE HYPO 25GX1X1/2 BEV (NEEDLE) ×3 IMPLANT
NS IRRIG 1000ML POUR BTL (IV SOLUTION) ×3 IMPLANT
PACK TOTAL JOINT (CUSTOM PROCEDURE TRAY) ×3 IMPLANT
PAD ARMBOARD 7.5X6 YLW CONV (MISCELLANEOUS) ×6 IMPLANT
SET HNDPC FAN SPRY TIP SCT (DISPOSABLE) ×1 IMPLANT
STRIP CLOSURE SKIN 1/2X4 (GAUZE/BANDAGES/DRESSINGS) ×4 IMPLANT
SUCTION FRAZIER HANDLE 10FR (MISCELLANEOUS) ×2
SUCTION TUBE FRAZIER 10FR DISP (MISCELLANEOUS) IMPLANT
SUT MNCRL AB 4-0 PS2 18 (SUTURE) ×3 IMPLANT
SUT VIC AB 0 CT1 27 (SUTURE) ×3
SUT VIC AB 0 CT1 27XBRD ANBCTR (SUTURE) IMPLANT
SUT VIC AB 1 CTX 36 (SUTURE) ×3
SUT VIC AB 1 CTX36XBRD ANBCTR (SUTURE) ×1 IMPLANT
SUT VIC AB 2-0 CT1 27 (SUTURE) ×6
SUT VIC AB 2-0 CT1 TAPERPNT 27 (SUTURE) ×2 IMPLANT
SYR 50ML LL SCALE MARK (SYRINGE) ×3 IMPLANT
SYR CONTROL 10ML LL (SYRINGE) ×2 IMPLANT
TOWEL GREEN STERILE (TOWEL DISPOSABLE) ×3 IMPLANT
TRAY CATH 16FR W/PLASTIC CATH (SET/KITS/TRAYS/PACK) IMPLANT
WATER STERILE IRR 1000ML POUR (IV SOLUTION) ×3 IMPLANT

## 2016-12-07 NOTE — Progress Notes (Signed)
Orthopedic Tech Progress Note Patient Details:  Taylor Travis 1955-09-25 [id number]  CPM Left Knee CPM Left Knee: On Left Knee Flexion (Degrees): 90 Left Knee Extension (Degrees): 0   [name] 12/07/2016, 9:55 AM ohf not alplied because pt's weight exceeds durability of frame; RN notified

## 2016-12-07 NOTE — Anesthesia Preprocedure Evaluation (Signed)
Anesthesia Evaluation  Patient identified by MRN, date of birth, ID band Patient awake    Reviewed: Allergy & Precautions, NPO status , Patient's Chart, lab work & pertinent test results  Airway Mallampati: II  TM Distance: >3 FB Neck ROM: Full    Dental  (+) Teeth Intact, Dental Advisory Given   Pulmonary former smoker,    breath sounds clear to auscultation       Cardiovascular  Rhythm:Regular Rate:Normal     Neuro/Psych    GI/Hepatic   Endo/Other    Renal/GU      Musculoskeletal   Abdominal   Peds  Hematology   Anesthesia Other Findings   Reproductive/Obstetrics                             Anesthesia Physical Anesthesia Plan  ASA: II  Anesthesia Plan: General   Post-op Pain Management:  Regional for Post-op pain   Induction: Intravenous  PONV Risk Score and Plan: Ondansetron and Dexamethasone  Airway Management Planned: Oral ETT  Additional Equipment:   Intra-op Plan:   Post-operative Plan: Extubation in OR  Informed Consent: I have reviewed the patients History and Physical, chart, labs and discussed the procedure including the risks, benefits and alternatives for the proposed anesthesia with the patient or authorized representative who has indicated his/her understanding and acceptance.     Dental advisory given  Plan Discussed with: CRNA and Anesthesiologist  Anesthesia Plan Comments:         Anesthesia Quick Evaluation  

## 2016-12-07 NOTE — Interval H&P Note (Signed)
History and Physical Interval Note:  12/07/2016 7:37 AM  Taylor Travis  has presented today for surgery, with the diagnosis of OA LEFT KNEE  The various methods of treatment have been discussed with the patient and family. After consideration of risks, benefits and other options for treatment, the patient has consented to  Procedure(s): LEFT TOTAL KNEE ARTHROPLASTY (Left) as a surgical intervention .  The patient's history has been reviewed, patient examined, no change in status, stable for surgery.  I have reviewed the patient's chart and labs.  Questions were answered to the patient's satisfaction.     Loreta Ave

## 2016-12-07 NOTE — Anesthesia Procedure Notes (Signed)
Procedure Name: Intubation Date/Time: 12/07/2016 7:56 AM Performed by: Teressa Lower Pre-anesthesia Checklist: Patient identified, Emergency Drugs available, Suction available and Patient being monitored Patient Re-evaluated:Patient Re-evaluated prior to induction Oxygen Delivery Method: Circle system utilized Preoxygenation: Pre-oxygenation with 100% oxygen Induction Type: IV induction Ventilation: Mask ventilation without difficulty Laryngoscope Size: Mac and 3 Grade View: Grade II Tube type: Oral Tube size: 7.5 mm Number of attempts: 1 Airway Equipment and Method: Stylet and Oral airway Placement Confirmation: ETT inserted through vocal cords under direct vision,  positive ETCO2 and breath sounds checked- equal and bilateral Secured at: 22 cm Tube secured with: Tape Dental Injury: Teeth and Oropharynx as per pre-operative assessment

## 2016-12-07 NOTE — Anesthesia Procedure Notes (Signed)
Anesthesia Regional Block: Adductor canal block   Pre-Anesthetic Checklist: ,, timeout performed, Correct Patient, Correct Site, Correct Laterality, Correct Procedure, Correct Position, site marked, Risks and benefits discussed,  Surgical consent,  Pre-op evaluation,  At surgeon's request and post-op pain management  Laterality: Left  Prep: chloraprep       Needles:  Injection technique: Single-shot  Needle Type: Echogenic Stimulator Needle     Needle Length: 9cm  Needle Gauge: 21     Additional Needles:   Procedures:,,,, ultrasound used (permanent image in chart),,,,  Narrative:  Start time: 12/07/2016 7:15 AM End time: 12/07/2016 7:20 AM Injection made incrementally with aspirations every 5 mL.  Performed by: Personally  Anesthesiologist: Valina Maes  Additional Notes: 20 cc 0.75% Ropivacaine injected easily

## 2016-12-07 NOTE — Discharge Instructions (Signed)

## 2016-12-07 NOTE — Evaluation (Signed)
Physical Therapy Evaluation Patient Details Name: Taylor Travis MRN: [id number] DOB: [birth year] Today's Date: 12/07/2016   History of Present Illness  Pt is a 61 y.o. female now s/p elective L TKA on 12/07/16. Pertinent PMH includes R TKA (2015), vertigo, arthritis, shoulder sx.     Clinical Impression  Pt presents with pain and an overall decrease in functional mobility secondary to above. PTA, pt indep and lives at home alone; mother plans to stay with her at d/c to provide assist if needed. Educ on precautions, positioning, therex, and importance of mobility. Today, pt able to transfer and amb with RW and min guard for balance; pt declined further mobility secondary to pain although she understands the importance of therex, ROM, and mobility. Pt would benefit from continued acute PT services to maximize functional mobility and independence prior to d/c with HHPT.     Follow Up Recommendations DC plan and follow up therapy as arranged by surgeon;Home health PT    Equipment Recommendations  None recommended by PT (owns DME)    Recommendations for Other Services OT consult     Precautions / Restrictions Precautions Precautions: Knee Restrictions Weight Bearing Restrictions: Yes LLE Weight Bearing: Weight bearing as tolerated      Mobility  Bed Mobility Overal bed mobility: Needs Assistance Bed Mobility: Supine to Sit     Supine to sit: Min guard;HOB elevated     General bed mobility comments: Cues for technque; increased time and effort secondary to pain  Transfers Overall transfer level: Needs assistance Equipment used: Rolling walker (2 wheeled) Transfers: Sit to/from Stand Sit to Stand: Min guard         General transfer comment: Stood from bed and toilet with RW and min guard for balance; min guard for pericare at toilet with no UE support  Ambulation/Gait Ambulation/Gait assistance: Min guard Ambulation Distance (Feet): 30 Feet Assistive device: Rolling  walker (2 wheeled) Gait Pattern/deviations: Step-through pattern;Decreased weight shift to left;Antalgic Gait velocity: Decreased Gait velocity interpretation: <1.8 ft/sec, indicative of risk for recurrent falls General Gait Details: Amb in room with RW and min guard for safety. Cues for increased WB onto LLE as pt can tolerate, and heel-to-toe pattern to encourage max knee ext  Stairs            Wheelchair Mobility    Modified Rankin (Stroke Patients Only)       Balance Overall balance assessment: Needs assistance Sitting-balance support: No upper extremity supported;Feet supported Sitting balance-Leahy Scale: Good     Standing balance support: No upper extremity supported;During functional activity;Bilateral upper extremity supported Standing balance-Leahy Scale: Fair Standing balance comment: Able to stand and don shorts with no UE support and min guard for balance                             Pertinent Vitals/Pain Pain Assessment: Faces Faces Pain Scale: Hurts little more Pain Location: L knee Pain Descriptors / Indicators: Aching;Grimacing;Discomfort Pain Intervention(s): Monitored during session;Ice applied    Home Living Family/patient expects to be discharged to:: Private residence Living Arrangements: Alone Available Help at Discharge: Family;Available 24 hours/day Type of Home: House Home Access: Ramped entrance     Home Layout: Laundry or work area in basement;Two level;Able to live on main level with bedroom/bathroom Home Equipment: Dan Humphreys - 2 wheels;Walker - 4 wheels;Toilet riser;Bedside commode Additional Comments: Mother is coming to stay with pt at d/c    Prior Function Level of  Independence: Independent               Hand Dominance        Extremity/Trunk Assessment   Upper Extremity Assessment Upper Extremity Assessment: Overall WFL for tasks assessed    Lower Extremity Assessment Lower Extremity Assessment: LLE  deficits/detail LLE Deficits / Details: LLE grossly 3/5 LLE: Unable to fully assess due to pain       Communication   Communication: No difficulties  Cognition Arousal/Alertness: Awake/alert Behavior During Therapy: WFL for tasks assessed/performed Overall Cognitive Status: Within Functional Limits for tasks assessed                                        General Comments      Exercises Total Joint Exercises Ankle Circles/Pumps: AROM;Both;Seated Long Arc Quad: AROM;Left;10 reps;Seated Knee Flexion: AAROM;Left;10 reps;Seated;Other (comment) (with washcloth) Goniometric ROM: L knee PROM flexion grossly 0-90   Assessment/Plan    PT Assessment Patient needs continued PT services  PT Problem List Decreased strength;Decreased range of motion;Decreased activity tolerance;Decreased balance;Decreased mobility;Decreased knowledge of use of DME;Pain       PT Treatment Interventions DME instruction;Gait training;Stair training;Functional mobility training;Therapeutic activities;Therapeutic exercise;Balance training;Patient/family education    PT Goals (Current goals can be found in the Care Plan section)  Acute Rehab PT Goals Patient Stated Goal: Return home with less pain PT Goal Formulation: With patient Time For Goal Achievement: 12/21/16 Potential to Achieve Goals: Good    Frequency 7X/week   Barriers to discharge        Co-evaluation               AM-PAC PT "6 Clicks" Daily Activity  Outcome Measure Difficulty turning over in bed (including adjusting bedclothes, sheets and blankets)?: A Little Difficulty moving from lying on back to sitting on the side of the bed? : A Little Difficulty sitting down on and standing up from a chair with arms (e.g., wheelchair, bedside commode, etc,.)?: A Little Help needed moving to and from a bed to chair (including a wheelchair)?: A Little Help needed walking in hospital room?: A Little Help needed climbing 3-5  steps with a railing? : A Little 6 Click Score: 18    End of Session Equipment Utilized During Treatment: Gait belt Activity Tolerance: Patient tolerated treatment well;Patient limited by pain Patient left: in chair;with call bell/phone within reach Nurse Communication: Mobility status PT Visit Diagnosis: Other abnormalities of gait and mobility (R26.89);Pain Pain - Right/Left: Left Pain - part of body: Knee    Time: 1478-2956 PT Time Calculation (min) (ACUTE ONLY): 28 min   Charges:   PT Evaluation $PT Eval Low Complexity: 1 Low PT Treatments $Therapeutic Exercise: 8-22 mins   PT G Codes:       Ina Homes, PT, DPT Acute Rehab Services  Pager: [phone]  Malachy Chamber 12/07/2016, 3:43 PM

## 2016-12-07 NOTE — Transfer of Care (Signed)
Immediate Anesthesia Transfer of Care Note  Patient: Taylor Travis  Procedure(s) Performed: Procedure(s): LEFT TOTAL KNEE ARTHROPLASTY (Left)  Patient Location: PACU  Anesthesia Type:GA combined with regional for post-op pain  Level of Consciousness: awake, alert  and oriented  Airway & Oxygen Therapy: Patient Spontanous Breathing and Patient connected to nasal cannula oxygen  Post-op Assessment: Report given to RN and Post -op Vital signs reviewed and stable  Post vital signs: Reviewed and stable  Last Vitals:  Vitals:   12/07/16 0732 12/07/16 0930  BP:    Pulse: 90   Resp: 13   Temp:  (!) (P) 36.1 C  SpO2: 98%     Last Pain:  Vitals:   12/07/16 0607  TempSrc: Oral         Complications: No apparent anesthesia complications

## 2016-12-07 NOTE — Discharge Summary (Addendum)
Patient ID: Taylor Travis MRN: [id number] DOB/AGE: [birth year] 62 y.o.  Admit date: 12/07/2016 Discharge date: 12/09/2016  Admission Diagnoses:  Principal Problem:   Primary localized osteoarthritis of left knee   Discharge Diagnoses:  Same  Past Medical History:  Diagnosis Date  . Anemia    during pregnancy  . Arthritis   . Family history of anesthesia complication    n/v  . Headache    occasionally  . Heart murmur   . PONV (postoperative nausea and vomiting)   . Vertigo     Surgeries: Procedure(s): LEFT TOTAL KNEE ARTHROPLASTY on 12/07/2016   Consultants:   Discharged Condition: Improved  Hospital Course: Taylor Travis is an 61 y.o. female who was admitted 12/07/2016 for operative treatment ofPrimary localized osteoarthritis of left knee. Patient has severe unremitting pain that affects sleep, daily activities, and work/hobbies. After pre-op clearance the patient was taken to the operating room on 12/07/2016 and underwent  Procedure(s): LEFT TOTAL KNEE ARTHROPLASTY.    Patient was given perioperative antibiotics:  Anti-infectives    Start     Dose/Rate Route Frequency Ordered Stop   12/07/16 1400  ceFAZolin (ANCEF) IVPB 2g/100 mL premix     2 g 200 mL/hr over 30 Minutes Intravenous Every 6 hours 12/07/16 1121 12/07/16 2044   12/07/16 0700  ceFAZolin (ANCEF) 3 g in dextrose 5 % 50 mL IVPB     3 g 130 mL/hr over 30 Minutes Intravenous To Surgery 12/06/16 1347 12/07/16 0758       Patient was given sequential compression devices, early ambulation, and chemoprophylaxis to prevent DVT.  Patient benefited maximally from hospital stay and there were no complications.    Recent vital signs:  Patient Vitals for the past 24 hrs:  BP Temp Temp src Pulse Resp SpO2  12/09/16 0447 122/63 97.7 F (36.5 C) Oral 72 16 98 %  12/08/16 2116 (!) 125/59 98.6 F (37 C) Oral 78 16 98 %  12/08/16 1500 127/64 98.8 F (37.1 C) Oral 81 16 98 %     Recent laboratory studies:    Recent Labs  12/08/16 0610 12/09/16 0557  WBC 9.2 9.9  HGB 11.0* 10.4*  HCT 33.1* 32.5*  PLT 168 166  NA 138  --   K 4.1  --   CL 105  --   CO2 23  --   BUN 10  --   CREATININE 0.75  --   GLUCOSE 125*  --   CALCIUM 8.3*  --      Discharge Medications:   Allergies as of 12/09/2016      Reactions   Oxycodone Nausea And Vomiting      Medication List    STOP taking these medications   acetaminophen 650 MG CR tablet Commonly known as:  TYLENOL   bisacodyl 5 MG EC tablet Commonly known as:  DULCOLAX     TAKE these medications   aspirin EC 325 MG tablet Take 1 tablet (325 mg total) by mouth daily.   HYDROcodone-acetaminophen 7.5-325 MG tablet Commonly known as:  NORCO Take 1-2 tablets by mouth every 4 (four) hours as needed for moderate pain.   loperamide 2 MG capsule Commonly known as:  IMODIUM Take 2 mg by mouth as needed for diarrhea or loose stools.   meclizine 50 MG tablet Commonly known as:  ANTIVERT Take 50 mg by mouth 3 (three) times daily as needed for dizziness or nausea.   methocarbamol 500 MG tablet Commonly known as:  ROBAXIN Take  1 tablet (500 mg total) by mouth 4 (four) times daily.   ondansetron 4 MG tablet Commonly known as:  ZOFRAN Take 1 tablet (4 mg total) by mouth every 8 (eight) hours as needed for nausea or vomiting.            Discharge Care Instructions        Start     Ordered   12/07/16 0000  aspirin EC 325 MG tablet  Daily    Comments:  1 tab a day for the next 30 days to prevent blood clots   12/07/16 0930   12/07/16 0000  methocarbamol (ROBAXIN) 500 MG tablet  4 times daily     12/07/16 0930   12/07/16 0000  ondansetron (ZOFRAN) 4 MG tablet  Every 8 hours PRN     12/07/16 0930   12/07/16 0000  HYDROcodone-acetaminophen (NORCO) 7.5-325 MG tablet  Every 4 hours PRN     12/07/16 0930      Diagnostic Studies: Dg Knee Left Port  Result Date: 12/07/2016 CLINICAL DATA:  61 year old female status post total knee  replacement. EXAM: PORTABLE LEFT KNEE - 1-2 VIEW COMPARISON:  None. FINDINGS: The patient is status post left total knee arthroplasty. The hardware appears intact and in anatomic alignment. Note is made of a moderate joint effusion with overlying soft tissue swelling and gas, likely postoperative in nature. Remaining osseous and soft tissue structures unremarkable. IMPRESSION: Status post total left knee arthroplasty, hardware intact and in anatomic alignment. Electronically Signed   By: Sande Brothers M.D.   On: 12/07/2016 09:52   Mm Digital Screening Bilateral  Result Date: 11/30/2016 CLINICAL DATA:  Screening. EXAM: DIGITAL SCREENING BILATERAL MAMMOGRAM WITH CAD COMPARISON:  Previous exam(s). ACR Breast Density Category b: There are scattered areas of fibroglandular density. FINDINGS: There are no findings suspicious for malignancy. Images were processed with CAD. IMPRESSION: No mammographic evidence of malignancy. A result letter of this screening mammogram will be mailed directly to the patient. RECOMMENDATION: Screening mammogram in one year. (Code:[id number]-B-01Y) BI-RADS CATEGORY  1: Negative. Electronically Signed   By: Sherian Rein M.D.   On: 11/30/2016 09:40    Disposition: 06-Home-Health Care Svc    Follow-up Information    Loreta Ave, MD. Schedule an appointment as soon as possible for a visit in 2 week(s).   Specialty:  Orthopedic Surgery Contact information: [address] [place] [address] [phone]        [facility] Follow up.   Specialty:  Home Health Services Why:  A representative from [facility] will contact you to arrange start date and time for your therapy. Contact information: [address] [place] [address] [phone]            Signed: Otilio Saber 12/09/2016, 7:53 AM

## 2016-12-07 NOTE — Anesthesia Postprocedure Evaluation (Signed)
Anesthesia Post Note  Patient: Taylor Travis  Procedure(s) Performed: Procedure(s) (LRB): LEFT TOTAL KNEE ARTHROPLASTY (Left)     Patient location during evaluation: PACU Anesthesia Type: General Level of consciousness: awake, awake and alert and oriented Pain management: pain level controlled Vital Signs Assessment: post-procedure vital signs reviewed and stable Respiratory status: spontaneous breathing, nonlabored ventilation and respiratory function stable Cardiovascular status: blood pressure returned to baseline Anesthetic complications: no    Last Vitals:  Vitals:   12/07/16 1046 12/07/16 1115  BP:  (!) 148/89  Pulse: 83 96  Resp: 11 16  Temp: (!) 36.2 C (!) 36.4 C  SpO2: 99% 99%    Last Pain:  Vitals:   12/07/16 1253  TempSrc:   PainSc: 8                  Katrin Grabel COKER

## 2016-12-08 ENCOUNTER — Encounter (HOSPITAL_COMMUNITY): Payer: Self-pay | Admitting: Orthopedic Surgery

## 2016-12-08 LAB — BASIC METABOLIC PANEL
Anion gap: 10 (ref 5–15)
BUN: 10 mg/dL (ref 6–20)
CALCIUM: 8.3 mg/dL — AB (ref 8.9–10.3)
CO2: 23 mmol/L (ref 22–32)
CREATININE: 0.75 mg/dL (ref 0.44–1.00)
Chloride: 105 mmol/L (ref 101–111)
GFR calc Af Amer: >60 mL/min (ref >60)
GFR calc non Af Amer: >60 mL/min (ref >60)
GLUCOSE: 125 mg/dL — AB (ref 65–99)
Potassium: 4.1 mmol/L (ref 3.5–5.1)
Sodium: 138 mmol/L (ref 135–145)

## 2016-12-08 LAB — CBC
HEMATOCRIT: 33.1 % — AB (ref 36.0–46.0)
Hemoglobin: 11 g/dL — ABNORMAL LOW (ref 12.0–15.0)
MCH: 28.7 pg (ref 26.0–34.0)
MCHC: 33.2 g/dL (ref 30.0–36.0)
MCV: 86.4 fL (ref 78.0–100.0)
Platelets: 168 10*3/uL (ref 150–400)
RBC: 3.83 MIL/uL — ABNORMAL LOW (ref 3.87–5.11)
RDW: 13.7 % (ref 11.5–15.5)
WBC: 9.2 10*3/uL (ref 4.0–10.5)

## 2016-12-08 NOTE — Evaluation (Signed)
Occupational Therapy Evaluation Patient Details Name: Taylor Travis MRN: [id number] DOB: Jun 21, 1955 Today's Date: 12/08/2016    History of Present Illness Pt is a 61 y.o. female now s/p elective L TKA on 12/07/16. Pertinent PMH includes R TKA (2015), vertigo, arthritis, shoulder sx.    Clinical Impression   Pt admitted as above. She participated in ADL retraining session for bed mobility, toilet transfer, grooming while standing at sink and shower transfer as well as LB dressing techniques. She is currently supervision-min A level for ADL's/transfers. Pt has DME at home and will have PRN assist from her mother after d/c. She denies any other acute OT needs at this time as she had R TKA in 2015. Will sign off.    Follow Up Recommendations  No OT follow up;Supervision - Intermittent    Equipment Recommendations  None recommended by OT;Other (comment) (Pt has DME and necessary assist at d/c)    Recommendations for Other Services       Precautions / Restrictions Precautions Precautions: Knee Restrictions Weight Bearing Restrictions: Yes LLE Weight Bearing: Weight bearing as tolerated      Mobility Bed Mobility Overal bed mobility: Modified Independent Bed Mobility: Supine to Sit     Supine to sit: Modified independent (Device/Increase time)     General bed mobility comments: Increased time, no physical assist  Transfers Overall transfer level: Needs assistance Equipment used: Rolling walker (2 wheeled) Transfers: Sit to/from [employer] Sit to Stand: Supervision Stand pivot transfers: Supervision;Min guard       General transfer comment: Good technique, increased time.    Balance Overall balance assessment: Needs assistance Sitting-balance support: No upper extremity supported;Feet supported Sitting balance-Leahy Scale: Good     Standing balance support: No upper extremity supported;During functional activity;Bilateral upper extremity  supported Standing balance-Leahy Scale: Good Standing balance comment: Able to stand at sink for grooming w/o UE support. Supervision for balance                           ADL either performed or assessed with clinical judgement   ADL Overall ADL's : Needs assistance/impaired Eating/Feeding: Independent   Grooming: Wash/dry hands;Wash/dry face;Oral care;Supervision/safety;Standing Grooming Details (indicate cue type and reason): Standing at sink for grooming Upper Body Bathing: Set up;Sitting   Lower Body Bathing: Min guard;Sit to/from stand   Upper Body Dressing : Independent   Lower Body Dressing: Sit to/from stand;Supervision/safety;Min guard Lower Body Dressing Details (indicate cue type and reason): Pt was supervision donning underwear and shorts bed lvel today per her report. Min guard assist to don sock on LLE only. Will have PRN assist at home after d/c Toilet Transfer: Supervision/safety;BSC;RW;Ambulation Toilet Transfer Details (indicate cue type and reason): 3:1 over toilet, increased time for tasks Toileting- Clothing Manipulation and Hygiene: Supervision/safety;Sit to/from stand   Tub/ Shower Transfer: Walk-in shower;Min guard;Cueing for safety;Cueing for sequencing;Ambulation;Rolling walker (Simulated ashower transfer using towel roll as small step. Pt educated in technique and safety with RW.) Tub/Shower Transfer Details (indicate cue type and reason): Pt has 3:1 at home that she may also use in shower Functional mobility during ADLs: Supervision/safety;Rolling walker General ADL Comments: Pt was educated in role of OT followed by assessment and AD Lretraining session for bed mobility, toilet transfer, grooming in standing at sink and shower transfer as well as LB dressing techniques. Pt has DME at home and will have PRN assist from her mother after d/c. She denies any other acute OT needs  at this time as she had R TKA in 2015. Will sign off.     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 No deficits)   Lower Extremity Assessment Lower Extremity Assessment: Defer to PT evaluation   Cervical / Trunk Assessment Cervical / Trunk Assessment: Normal   Communication Communication Communication: No difficulties   Cognition Arousal/Alertness: Awake/alert Behavior During Therapy: WFL for tasks assessed/performed Overall Cognitive Status: Within Functional Limits for tasks assessed                                     General Comments       Exercises     Shoulder Instructions      Home Living Family/patient expects to be discharged to:: Private residence Living Arrangements: Alone Available Help at Discharge: Family;Available 24 hours/day Type of Home: House Home Access: Ramped entrance     Home Layout: Laundry or work area in basement;Two level;Able to live on main level with bedroom/bathroom     Bathroom Shower/Tub: Walk-in shower;Door   [employer] Toilet: Standard     Home Equipment: Environmental consultant - 2 wheels;Walker - 4 wheels;Toilet riser;Bedside commode   Additional Comments: Mother is coming to stay with pt at d/c      Prior Functioning/Environment Level of Independence: Independent                 OT Problem List: Pain      OT Treatment/Interventions:      OT Goals(Current goals can be found  in the care plan section) Acute Rehab OT Goals Patient Stated Goal: Go home tomorrow OT Goal Formulation: All assessment and education complete, DC therapy  OT Frequency:     Barriers to D/C:            Co-evaluation              AM-PAC PT "6 Clicks" Daily Activity     Outcome Measure Help from another person eating meals?: None Help from another person taking care of personal grooming?: A Little Help from another person toileting, which includes using toliet, bedpan, or urinal?: A Little Help from another person bathing (including washing, rinsing, drying)?: A Little Help from another person to put on and taking off regular upper body clothing?: None Help from another person to put on and taking off regular lower body clothing?: A Little 6 Click Score: 20   End of Session Equipment Utilized During Treatment: Gait belt;Rolling walker CPM Left Knee CPM Left Knee: Off  Activity Tolerance: Patient tolerated treatment well Patient left: in chair;with call bell/phone within reach  OT Visit Diagnosis: Pain Pain - Right/Left: Left Pain - part of body: Knee                Time: 1610-9604 OT Time Calculation (min): 30 min Charges:  OT General Charges $OT Visit: 1 Visit OT Evaluation $  OT Eval Low Complexity: 1 Low OT Treatments $Self Care/Home Management : 8-22 mins G-Codes:      Alm Bustard, OTR/L 12/08/2016, 10:52 AM

## 2016-12-08 NOTE — Progress Notes (Addendum)
1059Physical Therapy Treatment Patient Details Name: Taylor Travis MRN: [id number] DOB: [birth year] Today's Date: 12/08/2016    History of Present Illness Pt is a 61 y.o. female now s/p elective L TKA on 12/07/16. Pertinent PMH includes R TKA (2015), vertigo, arthritis, shoulder sx.     PT Comments    Pt performed gait and reviewed exercises this am.  Will f/u in pm to progress mobility.  Plan for d/c home tomorrow.    Follow Up Recommendations  DC plan and follow up therapy as arranged by surgeon;Home health PT     Equipment Recommendations  None recommended by PT (owns DME)    Recommendations for Other Services OT consult     Precautions / Restrictions Precautions Precautions: Knee Restrictions Weight Bearing Restrictions: Yes LLE Weight Bearing: Weight bearing as tolerated    Mobility  Bed Mobility Overal bed mobility: Modified Independent Bed Mobility: Supine to Sit     Supine to sit: Modified independent (Device/Increase time)     General bed mobility comments: Pt sitting in recliner on arrival  Transfers Overall transfer level: Needs assistance Equipment used: Rolling walker (2 wheeled) Transfers: Sit to/from [employer] Sit to Stand: Min guard Stand pivot transfers: Supervision;Min guard       General transfer comment: Cues for hand placement to and from seated surface, patient required increased time.    Ambulation/Gait Ambulation/Gait assistance: Min guard Ambulation Distance (Feet): 80 Feet Assistive device: Rolling walker (2 wheeled) Gait Pattern/deviations: Step-through pattern;Decreased weight shift to left;Antalgic Gait velocity: Decreased   General Gait Details: Pt required cues for L foot clearance and to advance RW in safe manner.  Pt slow and guarded.  Cues for R heel strike and R toe off.     Stairs            Wheelchair Mobility    Modified Rankin (Stroke Patients Only)       Balance Overall balance  assessment: Needs assistance Sitting-balance support: No upper extremity supported;Feet supported Sitting balance-Leahy Scale: Good     Standing balance support: No upper extremity supported;During functional activity;Bilateral upper extremity supported Standing balance-Leahy Scale: Fair Standing balance comment: Able to stand at sink for grooming w/o UE support. Supervision for balance                            Cognition Arousal/Alertness: Awake/alert Behavior During Therapy: WFL for tasks assessed/performed Overall Cognitive Status: Within Functional Limits for tasks assessed                                        Exercises Total Joint Exercises Ankle Circles/Pumps: AROM;Both;10 reps;Supine Quad Sets: AROM;Right;10 reps;Supine Towel Squeeze: AROM;Both;10 reps;Supine Short Arc Quad: AROM;Right;10 reps;Supine Heel Slides: AROM;Right;10 reps;Supine Hip ABduction/ADduction: AROM;Right;10 reps;Supine Straight Leg Raises: AROM;Right;10 reps;Supine Long Arc Quad: AROM;Right;10 reps;Supine Goniometric ROM: grossly remain 0-90 L knee    General Comments        Pertinent Vitals/Pain Pain Assessment: 0-10 Pain Score: 4  Pain Location: L knee Pain Descriptors / Indicators: Aching;Grimacing;Discomfort Pain Intervention(s): Monitored during session;Repositioned    Home Living                      Prior Function            PT Goals (current goals can now be found in the care plan  section) Acute Rehab PT Goals Patient Stated Goal: Go home tomorrow Potential to Achieve Goals: Good Progress towards PT goals: Progressing toward goals    Frequency    7X/week      PT Plan Current plan remains appropriate    Co-evaluation              AM-PAC PT "6 Clicks" Daily Activity  Outcome Measure  Difficulty turning over in bed (including adjusting bedclothes, sheets and blankets)?: A Little Difficulty moving from lying on back to  sitting on the side of the bed? : A Little Difficulty sitting down on and standing up from a chair with arms (e.g., wheelchair, bedside commode, etc,.)?: A Little Help needed moving to and from a bed to chair (including a wheelchair)?: A Little Help needed walking in hospital room?: A Little Help needed climbing 3-5 steps with a railing? : A Little 6 Click Score: 18    End of Session Equipment Utilized During Treatment: Gait belt Activity Tolerance: Patient tolerated treatment well;Patient limited by pain Patient left: in chair;with call bell/phone within reach Nurse Communication: Mobility status PT Visit Diagnosis: Other abnormalities of gait and mobility (R26.89);Pain Pain - Right/Left: Left Pain - part of body: Knee     Time: 9604-5409 PT Time Calculation (min) (ACUTE ONLY): 20 min  Charges:  $Gait Training: 8-22 mins                    G Codes:       [name], PTA pager [phone]    [name] 12/08/2016, 2:45 PM

## 2016-12-08 NOTE — Op Note (Signed)
NAME:  Taylor Travis, Taylor Travis                    ACCOUNT NO.:  MEDICAL RECORD NO.:  [id number]  LOCATION:                                 FACILITY:  PHYSICIAN:  Loreta Ave, M.D. DATE OF BIRTH:  Aug 13, 1955  DATE OF PROCEDURE:  12/07/2016 DATE OF DISCHARGE:                              OPERATIVE REPORT   PREOPERATIVE DIAGNOSIS:  Left knee end-stage arthritis, primary generalized.  POSTOPERATIVE DIAGNOSIS:  Left knee end-stage arthritis, primary generalized.  PROCEDURE:  Modified minimally invasive left total knee replacement, Stryker Triathlon prosthesis.  All components press-fit.  Cruciate retaining #5 femoral component.  #5 tibial component, 9-mm CS insert. 32 mm patellar component.  SURGEON:  Loreta Ave, M.D.  ASSISTANT:  Mikey Kirschner, PA, present throughout the entire case and necessary for timely completion of procedure.  ANESTHESIA:  General.  ESTIMATED BLOOD LOSS:  Minimal.  SPECIMENS:  None.  CULTURES:  None.  COMPLICATIONS:  None.  DRESSINGS:  Soft compressive knee immobilizer.  TOURNIQUET TIME:  1 hour.  DESCRIPTION OF PROCEDURE:  The patient was brought to the operating room and after adequate anesthesia had been obtained, tourniquet applied. Prepped and draped in usual sterile fashion.  Exsanguinated with elevation of Esmarch.  Tourniquet inflated to 350 mmHg.  A longitudinal incision above the patella down to tibial tubercle.  Medial arthrotomy, vastus splitting.  Intramedullary guide.  Distal femur.  Intramedullary guide utilized for an 8 mm resection, 5 degrees of valgus.  Using epicondylar axis, the femur was sized, cut, and fitted for a #5 pegged cruciate retaining component.  Excellent bone stock.  Extramedullary guide.  Proximal tibial resection.  Size #5 component.  Rotation set with trials and hand reamed.  Patella exposed.  Posterior 10 mm removed. Drilled, sized, and fitted for a 32-mm component.  Knee copiously irrigated.  All  spurs and fragments removed.  All components were press- fit and seated.  Polyethylene attached to tibia, knee reduced.  Patella held with a clamp until it was compressed.  Wound irrigated again. Injected with Exparel.  Arthrotomy closed with #1 Vicryl with subcutaneous and subcuticular closure.  At completion, excellent biomechanical axis. Full motion, nicely balanced knee, good stability.  Good patellar tracking.  Anesthesia reversed.  Brought to the recovery room. Tolerated the surgery well.  No complications.     Loreta Ave, M.D.     DFM/MEDQ  D:  12/07/2016  T:  12/07/2016  Job:  [id number]

## 2016-12-08 NOTE — Progress Notes (Signed)
Physical Therapy Treatment Patient Details Name: Taylor Travis MRN: [id number] DOB: 14-May-1955 Today's Date: 12/08/2016    History of Present Illness Pt is a 61 y.o. female now s/p elective L TKA on 12/07/16. Pertinent PMH includes R TKA (2015), vertigo, arthritis, shoulder sx.     PT Comments    Pt performed increased gait this pm with progression to step through pattern.  Pt tolerated increased gait well.  PTA reviewed knee precautions with patient and reviewed supine exercises from HEP.  Plan in am for stair training.  Pt remains appropriate to return home with support from her family.   Follow Up Recommendations  DC plan and follow up therapy as arranged by surgeon;Home health PT     Equipment Recommendations  None recommended by PT (owns DME)    Recommendations for Other Services OT consult     Precautions / Restrictions Precautions Precautions: Knee Restrictions Weight Bearing Restrictions: Yes LLE Weight Bearing: Weight bearing as tolerated    Mobility  Bed Mobility               General bed mobility comments: Pt sitting in recliner on arrival  Transfers Overall transfer level: Needs assistance Equipment used: Rolling walker (2 wheeled) Transfers: Sit to/from Stand Sit to Stand: Supervision         General transfer comment: Cues for hand placement to and from seated surface, patient required increased time.    Ambulation/Gait Ambulation/Gait assistance: Min guard Ambulation Distance (Feet): 200 Feet Assistive device: Rolling walker (2 wheeled) Gait Pattern/deviations: Step-through pattern;Decreased weight shift to left;Antalgic Gait velocity: Decreased   General Gait Details: Pt required cues for L foot clearance and to advance RW in safe manner.  Pt slow and guarded.  Cues for R heel strike and R toe off.     Stairs            Wheelchair Mobility    Modified Rankin (Stroke Patients Only)       Balance Overall balance assessment:  Needs assistance   Sitting balance-Leahy Scale: Good       Standing balance-Leahy Scale: Fair                              Cognition Arousal/Alertness: Awake/alert Behavior During Therapy: WFL for tasks assessed/performed Overall Cognitive Status: Within Functional Limits for tasks assessed                                        Exercises Total Joint Exercises Ankle Circles/Pumps: AROM;Both;10 reps;Supine Quad Sets: AROM;Right;10 reps;Supine Towel Squeeze: AROM;Both;10 reps;Supine Short Arc Quad: AROM;Right;10 reps;Supine Heel Slides: AROM;Right;10 reps;Supine Hip ABduction/ADduction: AROM;Right;10 reps;Supine Straight Leg Raises: AROM;Right;10 reps;Supine    General Comments        Pertinent Vitals/Pain Pain Assessment: 0-10 Pain Score: 6  Pain Location: L knee Pain Descriptors / Indicators: Aching;Grimacing;Discomfort Pain Intervention(s): Monitored during session;Repositioned;Ice applied    Home Living                      Prior Function            PT Goals (current goals can now be found in the care plan section) Acute Rehab PT Goals Patient Stated Goal: Go home tomorrow Potential to Achieve Goals: Good Progress towards PT goals: Progressing toward goals    Frequency  7X/week      PT Plan Current plan remains appropriate    Co-evaluation              AM-PAC PT "6 Clicks" Daily Activity  Outcome Measure  Difficulty turning over in bed (including adjusting bedclothes, sheets and blankets)?: A Little Difficulty moving from lying on back to sitting on the side of the bed? : A Little Difficulty sitting down on and standing up from a chair with arms (e.g., wheelchair, bedside commode, etc,.)?: A Little Help needed moving to and from a bed to chair (including a wheelchair)?: A Little Help needed walking in hospital room?: A Little Help needed climbing 3-5 steps with a railing? : A Little 6 Click Score:  18    End of Session Equipment Utilized During Treatment: Gait belt Activity Tolerance: Patient tolerated treatment well;Patient limited by pain Patient left: in chair;with call bell/phone within reach Nurse Communication: Mobility status PT Visit Diagnosis: Other abnormalities of gait and mobility (R26.89);Pain Pain - Right/Left: Left Pain - part of body: Knee     Time: 4540-9811 PT Time Calculation (min) (ACUTE ONLY): 20 min  Charges:  $Gait Training: 8-22 mins                    G Codes:       [name], PTA pager [phone]    [name] 12/08/2016, 6:44 PM

## 2016-12-08 NOTE — Progress Notes (Signed)
Subjective: 1 Day Post-Op Procedure(s) (LRB): LEFT TOTAL KNEE ARTHROPLASTY (Left) Patient reports pain as mild.  Doing well this am.  Objective: Vital signs in last 24 hours: Temp:  [97 F (36.1 C)-98.9 F (37.2 C)] 98.4 F (36.9 C) (09/27 0649) Pulse Rate:  [79-104] 79 (09/27 0649) Resp:  [8-16] 16 (09/26 1528) BP: (121-148)/(54-118) 129/66 (09/27 0649) SpO2:  [95 %-99 %] 99 % (09/27 0649)  Intake/Output from previous day: 09/26 0701 - 09/27 0700 In: 2028.3 [P.O.:720; I.V.:1208.3; IV Piggyback:100] Out: 200 [Blood:200] Intake/Output this shift: No intake/output data recorded.   Recent Labs  12/08/16 0610  HGB 11.0*    Recent Labs  12/08/16 0610  WBC 9.2  RBC 3.83*  HCT 33.1*  PLT 168   No results for input(s): NA, K, CL, CO2, BUN, CREATININE, GLUCOSE, CALCIUM in the last 72 hours. No results for input(s): LABPT, INR in the last 72 hours.  Neurologically intact Neurovascular intact Sensation intact distally Intact pulses distally Dorsiflexion/Plantar flexion intact Incision: dressing C/D/I No cellulitis present Compartment soft  Assessment/Plan: 1 Day Post-Op Procedure(s) (LRB): LEFT TOTAL KNEE ARTHROPLASTY (Left) Advance diet Up with therapy D/C IV fluids Plan for discharge tomorrow home with hhpt WBAT LLE ABLA-mild and stable PLEASE PLACE KNEE HIGH TED HOSE TO RLE.  PLEASE REMOVE ACE BANDAGE TO LLE AND APPLY THIGH HIGH TED HOSE   Otilio Saber 12/08/2016, 7:16 AM

## 2016-12-08 NOTE — Care Management Note (Signed)
Case Management Note  Patient Details  Name: Taylor Travis MRN: [id number] Date of Birth: Jan 10, 1956  Subjective/Objective:    61 yr old female s/p left total knee arthroplasty.                Action/Plan: Case manager spoke with patient concerning discharge plan and DME. Patient was preoperatively setup with [facility], no changes. She has RW and 3in1 from previous surgery, CPM will be delivered to her home. Patient says her mother will assist her at discharge.   Expected Discharge Date:  12/09/16               Expected Discharge Plan:  Home w Home Health Services  In-House Referral:  NA  Discharge planning Services  CM Consult  Post Acute Care Choice:  Durable Medical Equipment, Home Health Choice offered to:  Patient  DME Arranged:  CPM (has RW and 3in1) DME Agency:  [employer]  HH Arranged:  PT HH Agency:  [facility] ([facility]  Status of Service:  Completed, signed off  If discussed at [employer], dates discussed:    Additional Comments:  Durenda Guthrie, RN 12/08/2016, 12:01 PM

## 2016-12-09 LAB — BASIC METABOLIC PANEL
ANION GAP: 7 (ref 5–15)
BUN: 11 mg/dL (ref 6–20)
CO2: 27 mmol/L (ref 22–32)
CREATININE: 0.69 mg/dL (ref 0.44–1.00)
Calcium: 8.3 mg/dL — ABNORMAL LOW (ref 8.9–10.3)
Chloride: 103 mmol/L (ref 101–111)
Glucose, Bld: 111 mg/dL — ABNORMAL HIGH (ref 65–99)
Potassium: 4 mmol/L (ref 3.5–5.1)
SODIUM: 137 mmol/L (ref 135–145)

## 2016-12-09 LAB — CBC
HEMATOCRIT: 32.5 % — AB (ref 36.0–46.0)
HEMOGLOBIN: 10.4 g/dL — AB (ref 12.0–15.0)
MCH: 27.9 pg (ref 26.0–34.0)
MCHC: 32 g/dL (ref 30.0–36.0)
MCV: 87.1 fL (ref 78.0–100.0)
Platelets: 166 10*3/uL (ref 150–400)
RBC: 3.73 MIL/uL — ABNORMAL LOW (ref 3.87–5.11)
RDW: 13.6 % (ref 11.5–15.5)
WBC: 9.9 10*3/uL (ref 4.0–10.5)

## 2016-12-09 NOTE — Progress Notes (Signed)
Subjective: 2 Days Post-Op Procedure(s) (LRB): LEFT TOTAL KNEE ARTHROPLASTY (Left) Patient reports pain as mild.  Doing very well this am  Objective: Vital signs in last 24 hours: Temp:  [97.7 F (36.5 C)-98.8 F (37.1 C)] 97.7 F (36.5 C) (09/28 0447) Pulse Rate:  [72-81] 72 (09/28 0447) Resp:  [16] 16 (09/28 0447) BP: (122-127)/(59-64) 122/63 (09/28 0447) SpO2:  [98 %] 98 % (09/28 0447)  Intake/Output from previous day: 09/27 0701 - 09/28 0700 In: 720 [P.O.:720] Out: -  Intake/Output this shift: No intake/output data recorded.   Recent Labs  12/08/16 0610 12/09/16 0557  HGB 11.0* 10.4*    Recent Labs  12/08/16 0610 12/09/16 0557  WBC 9.2 9.9  RBC 3.83* 3.73*  HCT 33.1* 32.5*  PLT 168 166    Recent Labs  12/08/16 0610  NA 138  K 4.1  CL 105  CO2 23  BUN 10  CREATININE 0.75  GLUCOSE 125*  CALCIUM 8.3*   No results for input(s): LABPT, INR in the last 72 hours.  Neurologically intact Neurovascular intact Sensation intact distally Intact pulses distally Dorsiflexion/Plantar flexion intact Incision: dressing C/D/I No cellulitis present Compartment soft  Assessment/Plan: 2 Days Post-Op Procedure(s) (LRB): LEFT TOTAL KNEE ARTHROPLASTY (Left) Advance diet Up with therapy Discharge home with home health after first session of PT WBAT LLE ABLA-MILD AND STABLE PLEASE REMOVE ACE  BANDAGE AND APPLY THIGH HIGH TED HOSE TO LLE.  PLEASE APPLY KNEE HIGH TED HOSE TO RLE   Otilio Saber 12/09/2016, 7:51 AM

## 2016-12-09 NOTE — Progress Notes (Signed)
Physical Therapy Treatment Patient Details Name: Taylor Travis MRN: [id number] DOB: 1955/06/20 Today's Date: 12/09/2016    History of Present Illness Pt is a 61 y.o. female now s/p elective L TKA on 12/07/16. Pertinent PMH includes R TKA (2015), vertigo, arthritis, shoulder sx.     PT Comments    Pt performed increased gait and functional mobility during session.  Pt able to transfer from various surfaces and requires supervision overall for safety.  Plan to return home with support from her mother.  Pt educated on supine HEP and reviewed technique and frequency to perform at home.     Follow Up Recommendations  DC plan and follow up therapy as arranged by surgeon;Home health PT     Equipment Recommendations  None recommended by PT (owns DME)    Recommendations for Other Services       Precautions / Restrictions Precautions Precautions: Knee Restrictions Weight Bearing Restrictions: Yes LLE Weight Bearing: Weight bearing as tolerated    Mobility  Bed Mobility Overal bed mobility: Modified Independent Bed Mobility: Supine to Sit     Supine to sit: Modified independent (Device/Increase time)     General bed mobility comments: No assistance needed.    Transfers Overall transfer level: Needs assistance Equipment used: Rolling walker (2 wheeled) Transfers: Sit to/from Stand Sit to Stand: Supervision         General transfer comment: Cues for hand placement to and from seated surface, patient required increased time.    Ambulation/Gait Ambulation/Gait assistance: Supervision Ambulation Distance (Feet): 200 Feet Assistive device: Rolling walker (2 wheeled) Gait Pattern/deviations: Step-through pattern;Decreased weight shift to left;Antalgic Gait velocity: Decreased   General Gait Details: Pt required cues for R foot clearance and to advance RW in safe manner.  Pt slow and guarded.  Cues for R heel strike and R toe off.     Stairs            Wheelchair  Mobility    Modified Rankin (Stroke Patients Only)       Balance Overall balance assessment: Needs assistance   Sitting balance-Leahy Scale: Good       Standing balance-Leahy Scale: Fair Standing balance comment: Able to stand at sink for grooming w/o UE support. Supervision for balance                            Cognition Arousal/Alertness: Awake/alert Behavior During Therapy: WFL for tasks assessed/performed Overall Cognitive Status: Within Functional Limits for tasks assessed                                        Exercises Total Joint Exercises Ankle Circles/Pumps: AROM;Both;10 reps;Supine Quad Sets: AROM;Right;10 reps;Supine Towel Squeeze: AROM;Both;10 reps;Supine Short Arc Quad: AROM;Right;10 reps;Supine Heel Slides: AROM;Right;10 reps;Supine Hip ABduction/ADduction: AROM;Right;10 reps;Supine Straight Leg Raises: AROM;Right;10 reps;Supine Goniometric ROM: 78 degrees flexion in L knee.      General Comments        Pertinent Vitals/Pain Pain Assessment: 0-10 Pain Score: 8  Pain Location: L knee Pain Descriptors / Indicators: Aching;Grimacing;Discomfort Pain Intervention(s): Monitored during session;Repositioned;Ice applied    Home Living                      Prior Function            PT Goals (current goals can now be found in  the care plan section) Acute Rehab PT Goals Patient Stated Goal: Go home tomorrow Potential to Achieve Goals: Good Progress towards PT goals: Progressing toward goals    Frequency    7X/week      PT Plan Current plan remains appropriate    Co-evaluation              AM-PAC PT "6 Clicks" Daily Activity  Outcome Measure  Difficulty turning over in bed (including adjusting bedclothes, sheets and blankets)?: A Little Difficulty moving from lying on back to sitting on the side of the bed? : A Little Difficulty sitting down on and standing up from a chair with arms (e.g.,  wheelchair, bedside commode, etc,.)?: A Little Help needed moving to and from a bed to chair (including a wheelchair)?: A Little Help needed walking in hospital room?: A Little Help needed climbing 3-5 steps with a railing? : A Little 6 Click Score: 18    End of Session Equipment Utilized During Treatment: Gait belt Activity Tolerance: Patient tolerated treatment well;Patient limited by pain Patient left: in chair;with call bell/phone within reach Nurse Communication: Mobility status PT Visit Diagnosis: Other abnormalities of gait and mobility (R26.89);Pain Pain - Right/Left: Left Pain - part of body: Knee     Time: 1610-9604 PT Time Calculation (min) (ACUTE ONLY): 38 min  Charges:  $Gait Training: 8-22 mins $Therapeutic Exercise: 8-22 mins $Therapeutic Activity: 8-22 mins                    G Codes:       [name], PTA pager [phone]    [name] 12/09/2016, 11:00 AM

## 2016-12-09 NOTE — Progress Notes (Signed)
Pt being discharged home via wheelchair with family. Pt alert and oriented x4. VSS. Pt c/o no pain at this time. No signs of respiratory distress. Education complete and care plans resolved. IV removed with catheter intact and pt tolerated well. No further issues at this time. Pt to follow up with PCP. Madeliene Tejera R, RN 

## 2017-10-17 ENCOUNTER — Other Ambulatory Visit: Payer: Self-pay | Admitting: Physician Assistant

## 2017-10-17 DIAGNOSIS — Z1231 Encounter for screening mammogram for malignant neoplasm of breast: Secondary | ICD-10-CM

## 2017-12-06 ENCOUNTER — Ambulatory Visit
Admission: RE | Admit: 2017-12-06 | Discharge: 2017-12-06 | Disposition: A | Discharge status: Home / Self Care | Payer: BC Managed Care – PPO | Source: Ambulatory Visit | Attending: Physician Assistant | Admitting: Physician Assistant

## 2017-12-06 DIAGNOSIS — Z1231 Encounter for screening mammogram for malignant neoplasm of breast: Secondary | ICD-10-CM

## 2018-09-29 IMAGING — DX DG KNEE 1-2V PORT*L*
1 series · 2 of 2 positions shown · non-contrast
Comparison: None.

CLINICAL DATA: 61-year-old female status post total knee
replacement.

EXAM:
PORTABLE LEFT KNEE - 1-2 VIEW

[Series 1: knee · 0.14mm/px · 2 of 2 slices shown]
[im 1/2]
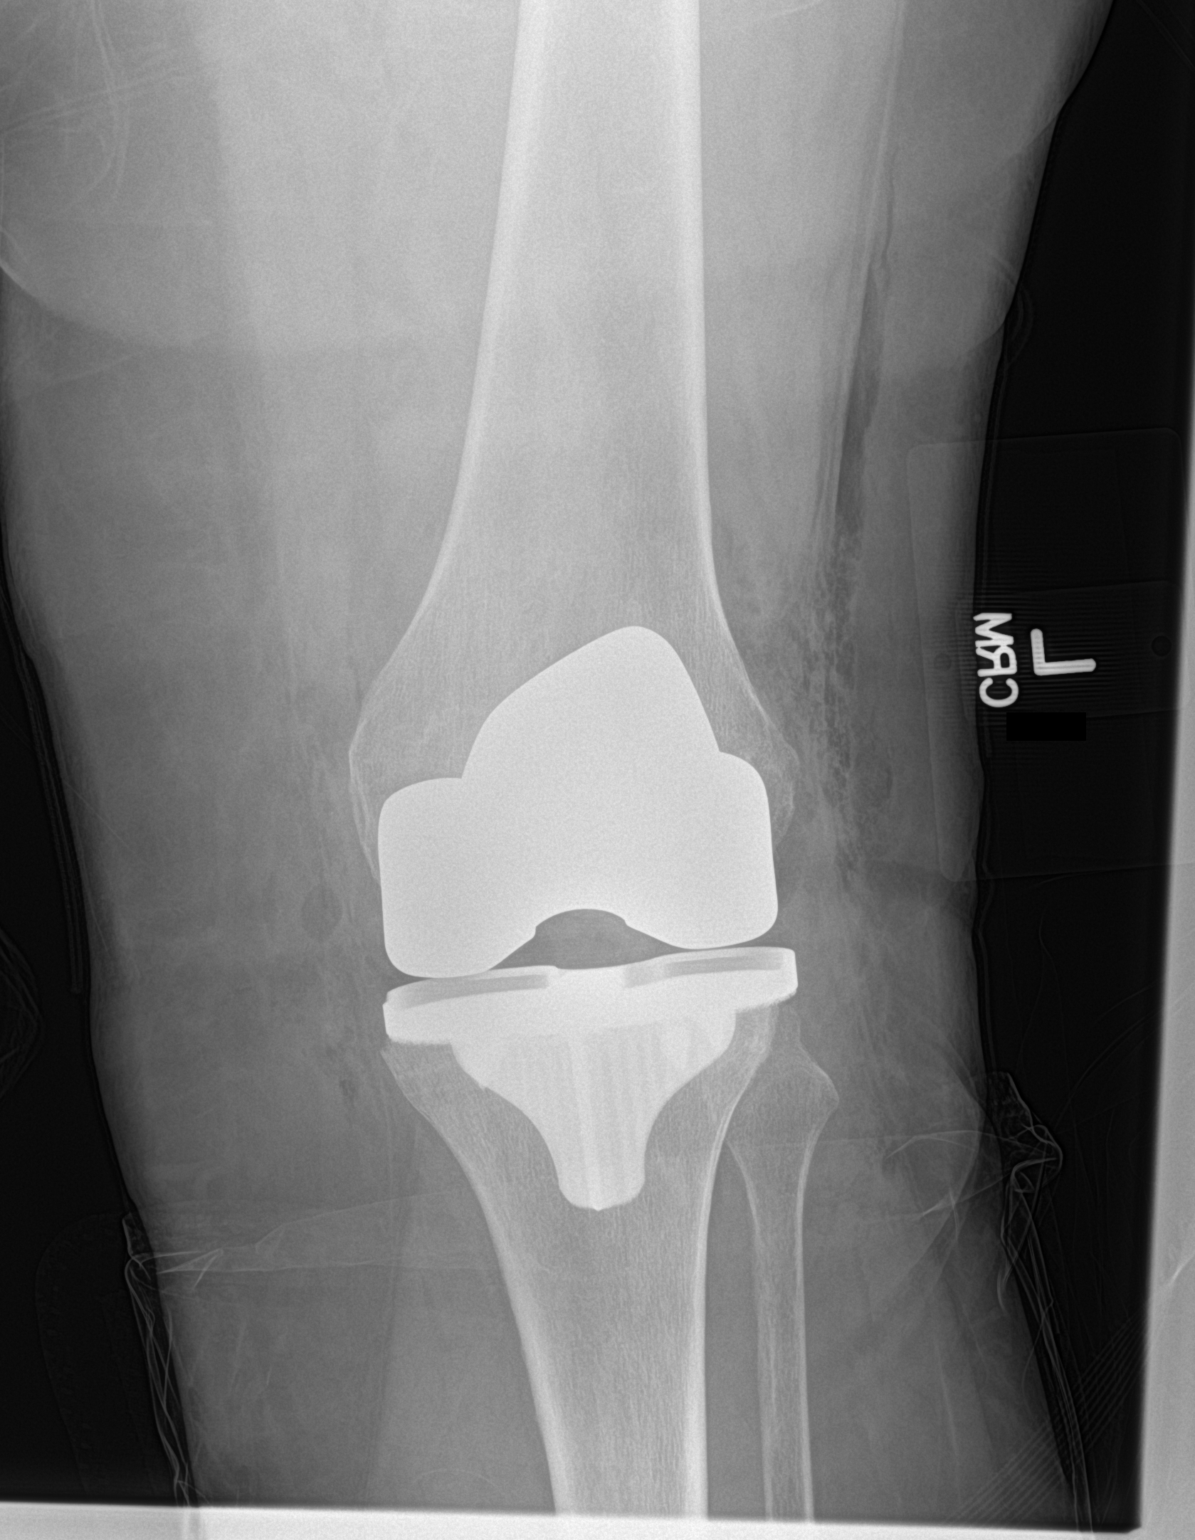
[im 2/2]
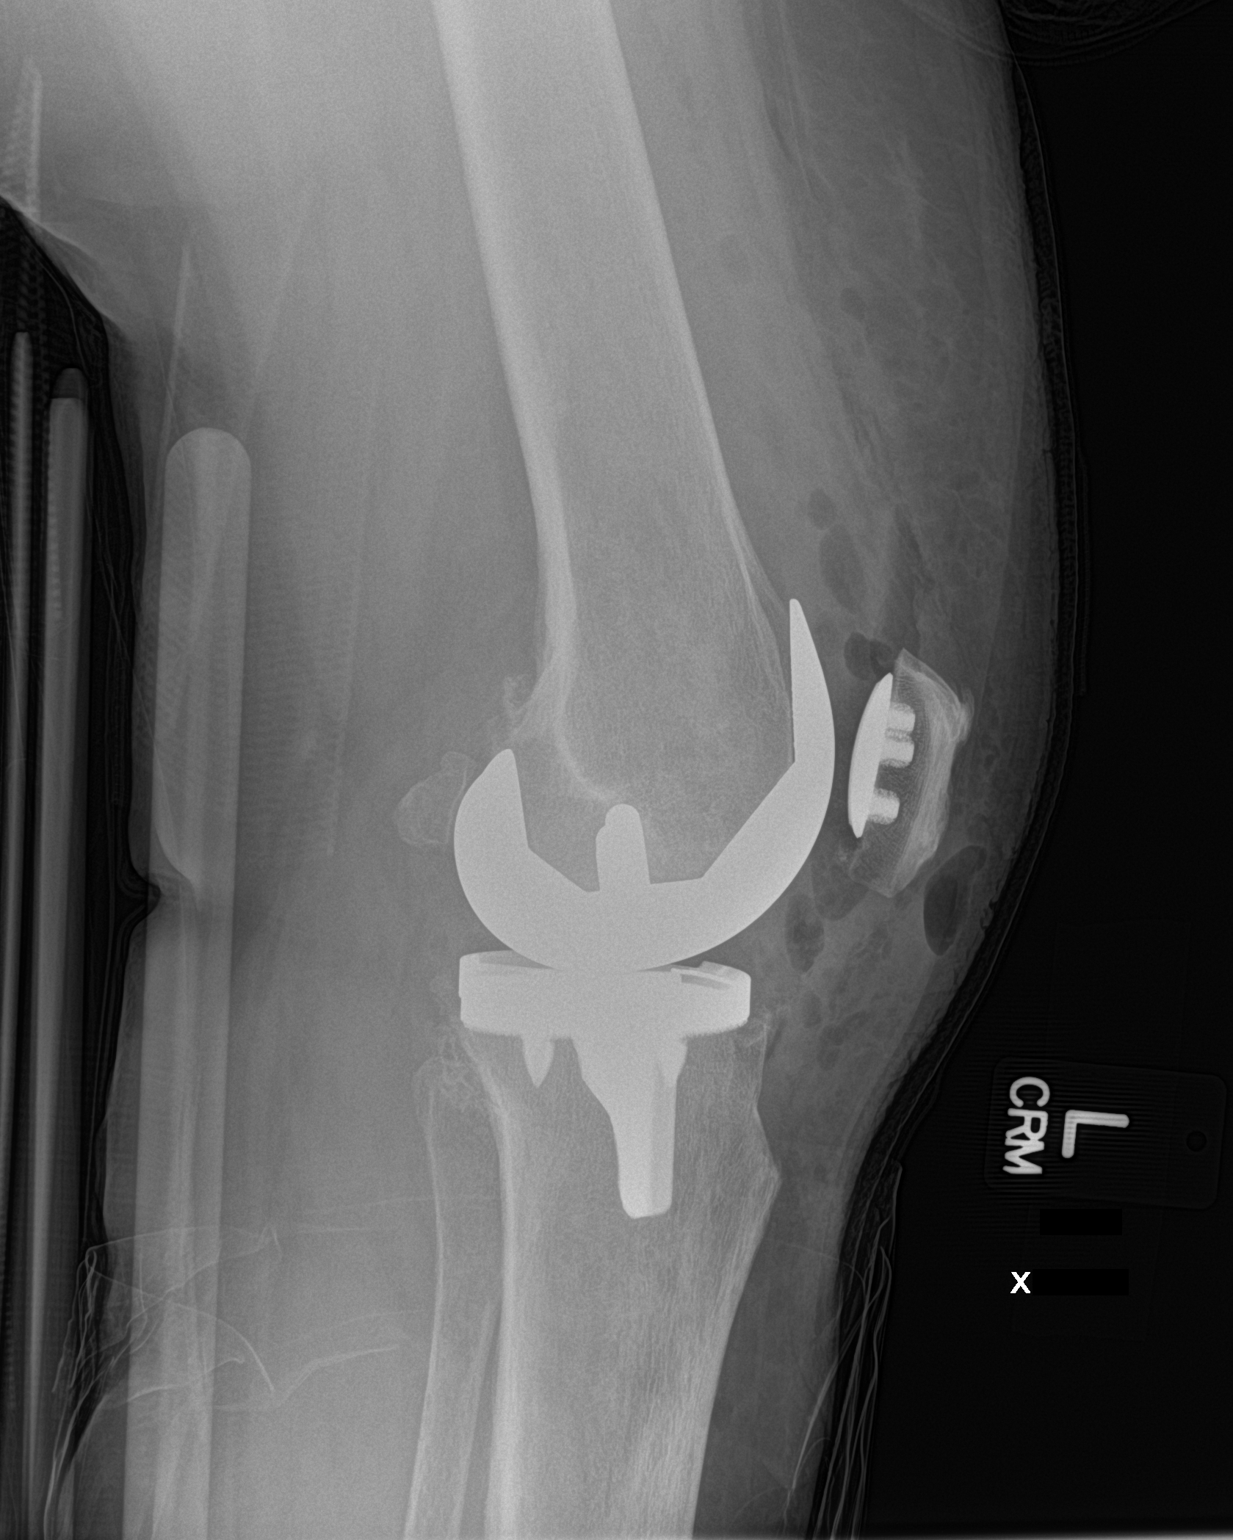

[2 of 2 positions shown; findings below may reference images not displayed]

FINDINGS: The patient is status post left total knee arthroplasty. The
hardware appears intact and in anatomic alignment. Note is made of a
moderate joint effusion with overlying soft tissue swelling and gas,
likely postoperative in nature.

Remaining osseous and soft tissue structures unremarkable.
IMPRESSION: Status post total left knee arthroplasty, hardware intact and in
anatomic alignment.

## 2018-11-08 ENCOUNTER — Other Ambulatory Visit: Payer: Self-pay | Admitting: Physician Assistant

## 2018-11-08 DIAGNOSIS — Z1231 Encounter for screening mammogram for malignant neoplasm of breast: Secondary | ICD-10-CM

## 2018-12-21 ENCOUNTER — Ambulatory Visit
Admission: RE | Admit: 2018-12-21 | Discharge: 2018-12-21 | Disposition: A | Discharge status: Home / Self Care | Payer: BC Managed Care – PPO | Source: Ambulatory Visit | Attending: Physician Assistant | Admitting: Physician Assistant

## 2018-12-21 ENCOUNTER — Other Ambulatory Visit: Payer: Self-pay

## 2018-12-21 DIAGNOSIS — Z1231 Encounter for screening mammogram for malignant neoplasm of breast: Secondary | ICD-10-CM

## 2019-12-13 ENCOUNTER — Other Ambulatory Visit: Payer: Self-pay | Admitting: Physician Assistant

## 2019-12-13 DIAGNOSIS — Z1231 Encounter for screening mammogram for malignant neoplasm of breast: Secondary | ICD-10-CM

## 2020-01-01 ENCOUNTER — Ambulatory Visit: Payer: BC Managed Care – PPO

## 2020-02-05 ENCOUNTER — Ambulatory Visit
Admission: RE | Admit: 2020-02-05 | Discharge: 2020-02-05 | Disposition: A | Discharge status: Home / Self Care | Payer: BC Managed Care – PPO | Source: Ambulatory Visit | Attending: Physician Assistant | Admitting: Physician Assistant

## 2020-02-05 ENCOUNTER — Other Ambulatory Visit: Payer: Self-pay

## 2020-02-05 DIAGNOSIS — Z1231 Encounter for screening mammogram for malignant neoplasm of breast: Secondary | ICD-10-CM

## 2020-02-27 DIAGNOSIS — Z0000 Encounter for general adult medical examination without abnormal findings: Secondary | ICD-10-CM | POA: Diagnosis not present

## 2020-02-27 DIAGNOSIS — Z1159 Encounter for screening for other viral diseases: Secondary | ICD-10-CM | POA: Diagnosis not present

## 2020-02-27 DIAGNOSIS — E7800 Pure hypercholesterolemia, unspecified: Secondary | ICD-10-CM | POA: Diagnosis not present

## 2020-02-27 DIAGNOSIS — E7439 Other disorders of intestinal carbohydrate absorption: Secondary | ICD-10-CM | POA: Diagnosis not present

## 2020-11-27 ENCOUNTER — Other Ambulatory Visit: Payer: Self-pay

## 2020-11-27 ENCOUNTER — Other Ambulatory Visit: Payer: BC Managed Care – PPO

## 2020-11-27 DIAGNOSIS — Z0283 Encounter for blood-alcohol and blood-drug test: Secondary | ICD-10-CM

## 2020-11-27 NOTE — Progress Notes (Unsigned)
Presents to [facility] for Random DOT Drug Screen.  [facility] Acct #:  [id number] [facility] Specimen #:  [id number]  AMD

## 2020-12-23 ENCOUNTER — Other Ambulatory Visit: Payer: Self-pay | Admitting: Physician Assistant

## 2020-12-23 DIAGNOSIS — Z1231 Encounter for screening mammogram for malignant neoplasm of breast: Secondary | ICD-10-CM

## 2021-02-10 ENCOUNTER — Ambulatory Visit
Admission: RE | Admit: 2021-02-10 | Discharge: 2021-02-10 | Disposition: A | Discharge status: Home / Self Care | Payer: Medicare Other | Source: Ambulatory Visit

## 2021-02-10 DIAGNOSIS — Z1231 Encounter for screening mammogram for malignant neoplasm of breast: Secondary | ICD-10-CM | POA: Diagnosis not present

## 2021-06-01 ENCOUNTER — Other Ambulatory Visit: Payer: Self-pay

## 2021-06-01 ENCOUNTER — Telehealth: Payer: Self-pay | Admitting: Acute Care

## 2021-06-01 NOTE — Telephone Encounter (Signed)
Spoke with patient by phone. She quit smoking in 2006 which makes her ineligible for [facility] LDCT.  Message faxed to referring provider. ?

## 2021-06-21 ENCOUNTER — Encounter: Payer: Medicare Other | Admitting: Acute Care

## 2021-07-28 DIAGNOSIS — H547 Unspecified visual loss: Secondary | ICD-10-CM | POA: Diagnosis not present

## 2021-07-28 DIAGNOSIS — Z6841 Body Mass Index (BMI) 40.0 and over, adult: Secondary | ICD-10-CM | POA: Diagnosis not present

## 2021-07-28 DIAGNOSIS — R32 Unspecified urinary incontinence: Secondary | ICD-10-CM | POA: Diagnosis not present

## 2021-07-28 DIAGNOSIS — Z803 Family history of malignant neoplasm of breast: Secondary | ICD-10-CM | POA: Diagnosis not present

## 2021-07-28 DIAGNOSIS — R030 Elevated blood-pressure reading, without diagnosis of hypertension: Secondary | ICD-10-CM | POA: Diagnosis not present

## 2021-07-28 DIAGNOSIS — Z811 Family history of alcohol abuse and dependence: Secondary | ICD-10-CM | POA: Diagnosis not present

## 2021-07-28 DIAGNOSIS — Z823 Family history of stroke: Secondary | ICD-10-CM | POA: Diagnosis not present

## 2021-07-28 DIAGNOSIS — E785 Hyperlipidemia, unspecified: Secondary | ICD-10-CM | POA: Diagnosis not present

## 2021-09-30 DIAGNOSIS — J189 Pneumonia, unspecified organism: Secondary | ICD-10-CM | POA: Diagnosis not present

## 2021-10-18 DIAGNOSIS — R059 Cough, unspecified: Secondary | ICD-10-CM | POA: Diagnosis not present

## 2021-11-02 DIAGNOSIS — J329 Chronic sinusitis, unspecified: Secondary | ICD-10-CM | POA: Diagnosis not present

## 2021-11-02 DIAGNOSIS — R059 Cough, unspecified: Secondary | ICD-10-CM | POA: Diagnosis not present

## 2022-01-11 ENCOUNTER — Other Ambulatory Visit: Payer: Self-pay | Admitting: Physician Assistant

## 2022-01-11 DIAGNOSIS — Z1231 Encounter for screening mammogram for malignant neoplasm of breast: Secondary | ICD-10-CM

## 2022-03-09 ENCOUNTER — Ambulatory Visit
Admission: RE | Admit: 2022-03-09 | Discharge: 2022-03-09 | Disposition: A | Discharge status: Home / Self Care | Payer: Medicare PPO | Source: Ambulatory Visit | Attending: Physician Assistant | Admitting: Physician Assistant

## 2022-03-09 DIAGNOSIS — R7303 Prediabetes: Secondary | ICD-10-CM | POA: Diagnosis not present

## 2022-03-09 DIAGNOSIS — N3281 Overactive bladder: Secondary | ICD-10-CM | POA: Diagnosis not present

## 2022-03-09 DIAGNOSIS — Z1231 Encounter for screening mammogram for malignant neoplasm of breast: Secondary | ICD-10-CM

## 2022-03-09 DIAGNOSIS — B354 Tinea corporis: Secondary | ICD-10-CM | POA: Diagnosis not present

## 2022-03-09 DIAGNOSIS — Z0000 Encounter for general adult medical examination without abnormal findings: Secondary | ICD-10-CM | POA: Diagnosis not present

## 2022-03-09 DIAGNOSIS — E7800 Pure hypercholesterolemia, unspecified: Secondary | ICD-10-CM | POA: Diagnosis not present

## 2022-03-30 DIAGNOSIS — R635 Abnormal weight gain: Secondary | ICD-10-CM | POA: Diagnosis not present

## 2022-03-30 DIAGNOSIS — Z1331 Encounter for screening for depression: Secondary | ICD-10-CM | POA: Diagnosis not present

## 2022-03-30 DIAGNOSIS — R7302 Impaired glucose tolerance (oral): Secondary | ICD-10-CM | POA: Diagnosis not present

## 2022-03-30 DIAGNOSIS — R030 Elevated blood-pressure reading, without diagnosis of hypertension: Secondary | ICD-10-CM | POA: Diagnosis not present

## 2022-03-30 DIAGNOSIS — E7800 Pure hypercholesterolemia, unspecified: Secondary | ICD-10-CM | POA: Diagnosis not present

## 2022-03-30 DIAGNOSIS — E559 Vitamin D deficiency, unspecified: Secondary | ICD-10-CM | POA: Diagnosis not present

## 2022-03-30 DIAGNOSIS — E88819 Insulin resistance, unspecified: Secondary | ICD-10-CM | POA: Diagnosis not present

## 2022-03-30 DIAGNOSIS — R0602 Shortness of breath: Secondary | ICD-10-CM | POA: Diagnosis not present

## 2022-05-04 ENCOUNTER — Other Ambulatory Visit (HOSPITAL_COMMUNITY): Payer: Self-pay

## 2022-05-04 DIAGNOSIS — Z6841 Body Mass Index (BMI) 40.0 and over, adult: Secondary | ICD-10-CM | POA: Diagnosis not present

## 2022-05-04 DIAGNOSIS — E559 Vitamin D deficiency, unspecified: Secondary | ICD-10-CM | POA: Diagnosis not present

## 2022-05-04 DIAGNOSIS — E7800 Pure hypercholesterolemia, unspecified: Secondary | ICD-10-CM | POA: Diagnosis not present

## 2022-05-04 DIAGNOSIS — R7302 Impaired glucose tolerance (oral): Secondary | ICD-10-CM | POA: Diagnosis not present

## 2022-05-04 DIAGNOSIS — R030 Elevated blood-pressure reading, without diagnosis of hypertension: Secondary | ICD-10-CM | POA: Diagnosis not present

## 2022-05-04 MED ORDER — WEGOVY 0.25 MG/0.5ML ~~LOC~~ SOAJ
0.2500 mg | SUBCUTANEOUS | 0 refills | Status: AC
  Filled 2022-05-04: qty 2, 28d supply, fill #0

## 2022-05-05 ENCOUNTER — Other Ambulatory Visit (HOSPITAL_COMMUNITY): Payer: Self-pay

## 2022-05-05 DIAGNOSIS — I10 Essential (primary) hypertension: Secondary | ICD-10-CM | POA: Diagnosis not present

## 2022-05-05 DIAGNOSIS — R7303 Prediabetes: Secondary | ICD-10-CM | POA: Diagnosis not present

## 2022-05-05 DIAGNOSIS — E7800 Pure hypercholesterolemia, unspecified: Secondary | ICD-10-CM | POA: Diagnosis not present

## 2022-05-05 DIAGNOSIS — Z6841 Body Mass Index (BMI) 40.0 and over, adult: Secondary | ICD-10-CM | POA: Diagnosis not present

## 2022-06-03 DIAGNOSIS — Z6841 Body Mass Index (BMI) 40.0 and over, adult: Secondary | ICD-10-CM | POA: Diagnosis not present

## 2022-06-03 DIAGNOSIS — I10 Essential (primary) hypertension: Secondary | ICD-10-CM | POA: Diagnosis not present

## 2022-06-03 DIAGNOSIS — E7800 Pure hypercholesterolemia, unspecified: Secondary | ICD-10-CM | POA: Diagnosis not present

## 2022-06-24 ENCOUNTER — Other Ambulatory Visit: Payer: Self-pay

## 2023-02-07 ENCOUNTER — Other Ambulatory Visit: Payer: Self-pay | Admitting: Physician Assistant

## 2023-02-07 DIAGNOSIS — Z1231 Encounter for screening mammogram for malignant neoplasm of breast: Secondary | ICD-10-CM

## 2023-03-10 DIAGNOSIS — Z1231 Encounter for screening mammogram for malignant neoplasm of breast: Secondary | ICD-10-CM

## 2023-03-13 ENCOUNTER — Other Ambulatory Visit: Payer: Self-pay | Admitting: Physician Assistant

## 2023-03-13 DIAGNOSIS — Z0000 Encounter for general adult medical examination without abnormal findings: Secondary | ICD-10-CM | POA: Diagnosis not present

## 2023-03-13 DIAGNOSIS — N3281 Overactive bladder: Secondary | ICD-10-CM | POA: Diagnosis not present

## 2023-03-13 DIAGNOSIS — E7800 Pure hypercholesterolemia, unspecified: Secondary | ICD-10-CM | POA: Diagnosis not present

## 2023-03-13 DIAGNOSIS — Z1211 Encounter for screening for malignant neoplasm of colon: Secondary | ICD-10-CM | POA: Diagnosis not present

## 2023-03-13 DIAGNOSIS — B354 Tinea corporis: Secondary | ICD-10-CM | POA: Diagnosis not present

## 2023-03-13 DIAGNOSIS — R809 Proteinuria, unspecified: Secondary | ICD-10-CM | POA: Diagnosis not present

## 2023-03-13 DIAGNOSIS — R7303 Prediabetes: Secondary | ICD-10-CM | POA: Diagnosis not present

## 2023-03-13 DIAGNOSIS — I10 Essential (primary) hypertension: Secondary | ICD-10-CM | POA: Diagnosis not present

## 2023-03-13 DIAGNOSIS — M8580 Other specified disorders of bone density and structure, unspecified site: Secondary | ICD-10-CM

## 2023-03-13 DIAGNOSIS — Z13820 Encounter for screening for osteoporosis: Secondary | ICD-10-CM | POA: Diagnosis not present

## 2023-03-14 ENCOUNTER — Ambulatory Visit
Admission: RE | Admit: 2023-03-14 | Discharge: 2023-03-14 | Disposition: A | Discharge status: Home / Self Care | Payer: Medicare PPO | Source: Ambulatory Visit | Attending: Physician Assistant | Admitting: Physician Assistant

## 2023-03-14 ENCOUNTER — Other Ambulatory Visit: Payer: Medicare PPO

## 2023-03-14 DIAGNOSIS — Z1231 Encounter for screening mammogram for malignant neoplasm of breast: Secondary | ICD-10-CM | POA: Diagnosis not present

## 2023-03-14 DIAGNOSIS — Z0000 Encounter for general adult medical examination without abnormal findings: Secondary | ICD-10-CM

## 2023-03-22 DIAGNOSIS — M19011 Primary osteoarthritis, right shoulder: Secondary | ICD-10-CM | POA: Diagnosis not present

## 2023-03-22 DIAGNOSIS — M542 Cervicalgia: Secondary | ICD-10-CM | POA: Diagnosis not present

## 2023-04-03 DIAGNOSIS — M19011 Primary osteoarthritis, right shoulder: Secondary | ICD-10-CM | POA: Diagnosis not present

## 2023-04-19 DIAGNOSIS — D128 Benign neoplasm of rectum: Secondary | ICD-10-CM | POA: Diagnosis not present

## 2023-04-19 DIAGNOSIS — Z860100 Personal history of colon polyps, unspecified: Secondary | ICD-10-CM | POA: Diagnosis not present

## 2023-04-19 DIAGNOSIS — D124 Benign neoplasm of descending colon: Secondary | ICD-10-CM | POA: Diagnosis not present

## 2023-04-19 DIAGNOSIS — K648 Other hemorrhoids: Secondary | ICD-10-CM | POA: Diagnosis not present

## 2023-04-19 DIAGNOSIS — Z09 Encounter for follow-up examination after completed treatment for conditions other than malignant neoplasm: Secondary | ICD-10-CM | POA: Diagnosis not present

## 2023-04-19 DIAGNOSIS — Z83719 Family history of colon polyps, unspecified: Secondary | ICD-10-CM | POA: Diagnosis not present

## 2023-04-21 DIAGNOSIS — D124 Benign neoplasm of descending colon: Secondary | ICD-10-CM | POA: Diagnosis not present

## 2023-04-21 DIAGNOSIS — D128 Benign neoplasm of rectum: Secondary | ICD-10-CM | POA: Diagnosis not present

## 2023-05-03 DIAGNOSIS — M7541 Impingement syndrome of right shoulder: Secondary | ICD-10-CM | POA: Diagnosis not present

## 2023-05-03 DIAGNOSIS — M19011 Primary osteoarthritis, right shoulder: Secondary | ICD-10-CM | POA: Diagnosis not present

## 2023-05-08 ENCOUNTER — Other Ambulatory Visit: Payer: Self-pay | Admitting: Orthopedic Surgery

## 2023-05-08 DIAGNOSIS — G8929 Other chronic pain: Secondary | ICD-10-CM

## 2023-05-10 ENCOUNTER — Other Ambulatory Visit: Payer: Medicare PPO

## 2023-05-17 ENCOUNTER — Ambulatory Visit
Admission: RE | Admit: 2023-05-17 | Discharge: 2023-05-17 | Disposition: A | Discharge status: Home / Self Care | Payer: Medicare PPO | Source: Ambulatory Visit | Attending: Orthopedic Surgery | Admitting: Orthopedic Surgery

## 2023-05-17 DIAGNOSIS — M25511 Pain in right shoulder: Secondary | ICD-10-CM | POA: Diagnosis not present

## 2023-05-17 DIAGNOSIS — G8929 Other chronic pain: Secondary | ICD-10-CM

## 2023-05-17 DIAGNOSIS — M19011 Primary osteoarthritis, right shoulder: Secondary | ICD-10-CM | POA: Diagnosis not present

## 2023-06-21 DIAGNOSIS — M19011 Primary osteoarthritis, right shoulder: Secondary | ICD-10-CM | POA: Diagnosis not present

## 2023-07-06 DIAGNOSIS — M25711 Osteophyte, right shoulder: Secondary | ICD-10-CM | POA: Diagnosis not present

## 2023-07-06 DIAGNOSIS — G8918 Other acute postprocedural pain: Secondary | ICD-10-CM | POA: Diagnosis not present

## 2023-07-06 DIAGNOSIS — M19011 Primary osteoarthritis, right shoulder: Secondary | ICD-10-CM | POA: Diagnosis not present

## 2023-07-10 DIAGNOSIS — M25611 Stiffness of right shoulder, not elsewhere classified: Secondary | ICD-10-CM | POA: Diagnosis not present

## 2023-07-10 DIAGNOSIS — M19011 Primary osteoarthritis, right shoulder: Secondary | ICD-10-CM | POA: Diagnosis not present

## 2023-07-10 DIAGNOSIS — M6281 Muscle weakness (generalized): Secondary | ICD-10-CM | POA: Diagnosis not present

## 2023-07-12 DIAGNOSIS — M19011 Primary osteoarthritis, right shoulder: Secondary | ICD-10-CM | POA: Diagnosis not present

## 2023-07-12 DIAGNOSIS — M6281 Muscle weakness (generalized): Secondary | ICD-10-CM | POA: Diagnosis not present

## 2023-07-12 DIAGNOSIS — M25611 Stiffness of right shoulder, not elsewhere classified: Secondary | ICD-10-CM | POA: Diagnosis not present

## 2023-07-17 DIAGNOSIS — M25611 Stiffness of right shoulder, not elsewhere classified: Secondary | ICD-10-CM | POA: Diagnosis not present

## 2023-07-17 DIAGNOSIS — M19011 Primary osteoarthritis, right shoulder: Secondary | ICD-10-CM | POA: Diagnosis not present

## 2023-07-17 DIAGNOSIS — M6281 Muscle weakness (generalized): Secondary | ICD-10-CM | POA: Diagnosis not present

## 2023-07-19 DIAGNOSIS — M25611 Stiffness of right shoulder, not elsewhere classified: Secondary | ICD-10-CM | POA: Diagnosis not present

## 2023-07-19 DIAGNOSIS — M19011 Primary osteoarthritis, right shoulder: Secondary | ICD-10-CM | POA: Diagnosis not present

## 2023-07-19 DIAGNOSIS — M6281 Muscle weakness (generalized): Secondary | ICD-10-CM | POA: Diagnosis not present

## 2023-07-21 DIAGNOSIS — M19011 Primary osteoarthritis, right shoulder: Secondary | ICD-10-CM | POA: Diagnosis not present

## 2023-07-26 DIAGNOSIS — M25611 Stiffness of right shoulder, not elsewhere classified: Secondary | ICD-10-CM | POA: Diagnosis not present

## 2023-07-26 DIAGNOSIS — M6281 Muscle weakness (generalized): Secondary | ICD-10-CM | POA: Diagnosis not present

## 2023-07-26 DIAGNOSIS — M19011 Primary osteoarthritis, right shoulder: Secondary | ICD-10-CM | POA: Diagnosis not present

## 2023-08-03 DIAGNOSIS — I10 Essential (primary) hypertension: Secondary | ICD-10-CM | POA: Diagnosis not present

## 2023-08-09 DIAGNOSIS — M6281 Muscle weakness (generalized): Secondary | ICD-10-CM | POA: Diagnosis not present

## 2023-08-09 DIAGNOSIS — M25611 Stiffness of right shoulder, not elsewhere classified: Secondary | ICD-10-CM | POA: Diagnosis not present

## 2023-08-09 DIAGNOSIS — M19011 Primary osteoarthritis, right shoulder: Secondary | ICD-10-CM | POA: Diagnosis not present

## 2023-08-12 DIAGNOSIS — E7800 Pure hypercholesterolemia, unspecified: Secondary | ICD-10-CM | POA: Diagnosis not present

## 2023-08-12 DIAGNOSIS — I10 Essential (primary) hypertension: Secondary | ICD-10-CM | POA: Diagnosis not present

## 2023-08-14 DIAGNOSIS — M25611 Stiffness of right shoulder, not elsewhere classified: Secondary | ICD-10-CM | POA: Diagnosis not present

## 2023-08-14 DIAGNOSIS — M19011 Primary osteoarthritis, right shoulder: Secondary | ICD-10-CM | POA: Diagnosis not present

## 2023-08-14 DIAGNOSIS — M6281 Muscle weakness (generalized): Secondary | ICD-10-CM | POA: Diagnosis not present

## 2023-08-16 DIAGNOSIS — M6281 Muscle weakness (generalized): Secondary | ICD-10-CM | POA: Diagnosis not present

## 2023-08-16 DIAGNOSIS — M19011 Primary osteoarthritis, right shoulder: Secondary | ICD-10-CM | POA: Diagnosis not present

## 2023-08-16 DIAGNOSIS — M25611 Stiffness of right shoulder, not elsewhere classified: Secondary | ICD-10-CM | POA: Diagnosis not present

## 2023-08-28 DIAGNOSIS — M19011 Primary osteoarthritis, right shoulder: Secondary | ICD-10-CM | POA: Diagnosis not present

## 2023-08-28 DIAGNOSIS — M6281 Muscle weakness (generalized): Secondary | ICD-10-CM | POA: Diagnosis not present

## 2023-08-28 DIAGNOSIS — M25611 Stiffness of right shoulder, not elsewhere classified: Secondary | ICD-10-CM | POA: Diagnosis not present

## 2023-08-30 DIAGNOSIS — M25611 Stiffness of right shoulder, not elsewhere classified: Secondary | ICD-10-CM | POA: Diagnosis not present

## 2023-08-30 DIAGNOSIS — M6281 Muscle weakness (generalized): Secondary | ICD-10-CM | POA: Diagnosis not present

## 2023-08-30 DIAGNOSIS — M19011 Primary osteoarthritis, right shoulder: Secondary | ICD-10-CM | POA: Diagnosis not present

## 2023-09-07 DIAGNOSIS — M25611 Stiffness of right shoulder, not elsewhere classified: Secondary | ICD-10-CM | POA: Diagnosis not present

## 2023-09-07 DIAGNOSIS — M6281 Muscle weakness (generalized): Secondary | ICD-10-CM | POA: Diagnosis not present

## 2023-09-07 DIAGNOSIS — M19011 Primary osteoarthritis, right shoulder: Secondary | ICD-10-CM | POA: Diagnosis not present

## 2023-09-11 DIAGNOSIS — I10 Essential (primary) hypertension: Secondary | ICD-10-CM | POA: Diagnosis not present

## 2023-09-11 DIAGNOSIS — E7800 Pure hypercholesterolemia, unspecified: Secondary | ICD-10-CM | POA: Diagnosis not present

## 2023-09-11 DIAGNOSIS — H2513 Age-related nuclear cataract, bilateral: Secondary | ICD-10-CM | POA: Diagnosis not present

## 2023-09-11 DIAGNOSIS — H43813 Vitreous degeneration, bilateral: Secondary | ICD-10-CM | POA: Diagnosis not present

## 2023-09-18 DIAGNOSIS — M19011 Primary osteoarthritis, right shoulder: Secondary | ICD-10-CM | POA: Diagnosis not present

## 2023-09-29 ENCOUNTER — Encounter: Payer: Self-pay | Admitting: Advanced Practice Midwife

## 2023-10-01 DIAGNOSIS — I10 Essential (primary) hypertension: Secondary | ICD-10-CM | POA: Diagnosis not present

## 2023-10-12 DIAGNOSIS — I10 Essential (primary) hypertension: Secondary | ICD-10-CM | POA: Diagnosis not present

## 2023-10-12 DIAGNOSIS — E7800 Pure hypercholesterolemia, unspecified: Secondary | ICD-10-CM | POA: Diagnosis not present

## 2023-10-31 DIAGNOSIS — I10 Essential (primary) hypertension: Secondary | ICD-10-CM | POA: Diagnosis not present

## 2023-11-02 ENCOUNTER — Other Ambulatory Visit: Payer: Medicare PPO

## 2023-11-12 DIAGNOSIS — E7800 Pure hypercholesterolemia, unspecified: Secondary | ICD-10-CM | POA: Diagnosis not present

## 2023-11-12 DIAGNOSIS — I10 Essential (primary) hypertension: Secondary | ICD-10-CM | POA: Diagnosis not present

## 2023-11-30 DIAGNOSIS — I10 Essential (primary) hypertension: Secondary | ICD-10-CM | POA: Diagnosis not present

## 2023-12-12 DIAGNOSIS — E7800 Pure hypercholesterolemia, unspecified: Secondary | ICD-10-CM | POA: Diagnosis not present

## 2023-12-12 DIAGNOSIS — I10 Essential (primary) hypertension: Secondary | ICD-10-CM | POA: Diagnosis not present

## 2023-12-18 DIAGNOSIS — Z96611 Presence of right artificial shoulder joint: Secondary | ICD-10-CM | POA: Diagnosis not present

## 2023-12-28 ENCOUNTER — Ambulatory Visit (HOSPITAL_BASED_OUTPATIENT_CLINIC_OR_DEPARTMENT_OTHER)
Admission: RE | Admit: 2023-12-28 | Discharge: 2023-12-28 | Disposition: A | Discharge status: Home / Self Care | Source: Ambulatory Visit | Attending: Physician Assistant | Admitting: Physician Assistant

## 2023-12-28 DIAGNOSIS — Z780 Asymptomatic menopausal state: Secondary | ICD-10-CM | POA: Diagnosis not present

## 2023-12-28 DIAGNOSIS — M8580 Other specified disorders of bone density and structure, unspecified site: Secondary | ICD-10-CM | POA: Insufficient documentation

## 2023-12-30 DIAGNOSIS — I10 Essential (primary) hypertension: Secondary | ICD-10-CM | POA: Diagnosis not present

## 2024-01-09 DIAGNOSIS — M1990 Unspecified osteoarthritis, unspecified site: Secondary | ICD-10-CM | POA: Diagnosis not present

## 2024-01-09 DIAGNOSIS — I10 Essential (primary) hypertension: Secondary | ICD-10-CM | POA: Diagnosis not present

## 2024-01-09 DIAGNOSIS — Z8249 Family history of ischemic heart disease and other diseases of the circulatory system: Secondary | ICD-10-CM | POA: Diagnosis not present

## 2024-01-09 DIAGNOSIS — Z87891 Personal history of nicotine dependence: Secondary | ICD-10-CM | POA: Diagnosis not present

## 2024-01-09 DIAGNOSIS — R32 Unspecified urinary incontinence: Secondary | ICD-10-CM | POA: Diagnosis not present

## 2024-01-09 DIAGNOSIS — Z823 Family history of stroke: Secondary | ICD-10-CM | POA: Diagnosis not present

## 2024-01-09 DIAGNOSIS — E669 Obesity, unspecified: Secondary | ICD-10-CM | POA: Diagnosis not present

## 2024-01-09 DIAGNOSIS — Z791 Long term (current) use of non-steroidal anti-inflammatories (NSAID): Secondary | ICD-10-CM | POA: Diagnosis not present

## 2024-01-09 DIAGNOSIS — E785 Hyperlipidemia, unspecified: Secondary | ICD-10-CM | POA: Diagnosis not present

## 2024-01-12 DIAGNOSIS — E7800 Pure hypercholesterolemia, unspecified: Secondary | ICD-10-CM | POA: Diagnosis not present

## 2024-01-12 DIAGNOSIS — I10 Essential (primary) hypertension: Secondary | ICD-10-CM | POA: Diagnosis not present

## 2024-01-29 ENCOUNTER — Other Ambulatory Visit: Payer: Self-pay | Admitting: Physician Assistant

## 2024-01-29 DIAGNOSIS — I10 Essential (primary) hypertension: Secondary | ICD-10-CM | POA: Diagnosis not present

## 2024-01-29 DIAGNOSIS — Z1231 Encounter for screening mammogram for malignant neoplasm of breast: Secondary | ICD-10-CM

## 2024-02-11 DIAGNOSIS — E7800 Pure hypercholesterolemia, unspecified: Secondary | ICD-10-CM | POA: Diagnosis not present

## 2024-02-11 DIAGNOSIS — I10 Essential (primary) hypertension: Secondary | ICD-10-CM | POA: Diagnosis not present

## 2024-03-15 ENCOUNTER — Ambulatory Visit
Admission: RE | Admit: 2024-03-15 | Discharge: 2024-03-15 | Disposition: A | Discharge status: Home / Self Care | Source: Ambulatory Visit | Attending: Physician Assistant | Admitting: Physician Assistant

## 2024-03-15 DIAGNOSIS — Z1231 Encounter for screening mammogram for malignant neoplasm of breast: Secondary | ICD-10-CM
